# Patient Record
Sex: Female | Born: 1985
Health system: Southern US, Community
[De-identification: ages and names within clinical notes are randomized; demographics above are authoritative.]

## PROBLEM LIST (undated history)

## (undated) DIAGNOSIS — O139 Gestational [pregnancy-induced] hypertension without significant proteinuria, unspecified trimester: Secondary | ICD-10-CM

## (undated) DIAGNOSIS — N730 Acute parametritis and pelvic cellulitis: Secondary | ICD-10-CM

## (undated) DIAGNOSIS — R51 Headache: Secondary | ICD-10-CM

## (undated) DIAGNOSIS — Z889 Allergy status to unspecified drugs, medicaments and biological substances status: Secondary | ICD-10-CM

## (undated) DIAGNOSIS — N760 Acute vaginitis: Secondary | ICD-10-CM

## (undated) DIAGNOSIS — N390 Urinary tract infection, site not specified: Secondary | ICD-10-CM

## (undated) DIAGNOSIS — B9620 Unspecified Escherichia coli [E. coli] as the cause of diseases classified elsewhere: Secondary | ICD-10-CM

## (undated) DIAGNOSIS — B9689 Other specified bacterial agents as the cause of diseases classified elsewhere: Secondary | ICD-10-CM

## (undated) HISTORY — PX: FOOT SURGERY: SHX648

---

## 2009-09-08 ENCOUNTER — Emergency Department (HOSPITAL_COMMUNITY): Admission: EM | Admit: 2009-09-08 | Discharge: 2009-09-08 | Payer: Self-pay | Admitting: Emergency Medicine

## 2010-09-18 LAB — URINALYSIS, ROUTINE W REFLEX MICROSCOPIC
Glucose, UA: NEGATIVE mg/dL
Ketones, ur: 15 mg/dL — AB
Protein, ur: 30 mg/dL — AB
pH: 6.5 (ref 5.0–8.0)

## 2010-09-18 LAB — URINE MICROSCOPIC-ADD ON

## 2011-04-22 ENCOUNTER — Ambulatory Visit (INDEPENDENT_AMBULATORY_CARE_PROVIDER_SITE_OTHER): Payer: Self-pay

## 2011-04-22 ENCOUNTER — Inpatient Hospital Stay (INDEPENDENT_AMBULATORY_CARE_PROVIDER_SITE_OTHER)
Admission: RE | Admit: 2011-04-22 | Discharge: 2011-04-22 | Disposition: A | Discharge status: Home / Self Care | Payer: Self-pay | Source: Ambulatory Visit | Attending: Family Medicine | Admitting: Family Medicine

## 2011-04-22 DIAGNOSIS — J45909 Unspecified asthma, uncomplicated: Secondary | ICD-10-CM

## 2011-04-22 DIAGNOSIS — J069 Acute upper respiratory infection, unspecified: Secondary | ICD-10-CM

## 2011-04-28 ENCOUNTER — Inpatient Hospital Stay (HOSPITAL_COMMUNITY)
Admission: EM | Admit: 2011-04-28 | Discharge: 2011-04-30 | DRG: 418 | Disposition: A | Discharge status: Home / Self Care | Payer: Medicaid Other | Source: Ambulatory Visit | Attending: Surgery | Admitting: Surgery

## 2011-04-28 DIAGNOSIS — N390 Urinary tract infection, site not specified: Secondary | ICD-10-CM | POA: Diagnosis present

## 2011-04-28 DIAGNOSIS — K8000 Calculus of gallbladder with acute cholecystitis without obstruction: Principal | ICD-10-CM | POA: Diagnosis present

## 2011-04-28 DIAGNOSIS — J45909 Unspecified asthma, uncomplicated: Secondary | ICD-10-CM | POA: Diagnosis present

## 2011-04-28 DIAGNOSIS — K810 Acute cholecystitis: Secondary | ICD-10-CM

## 2011-04-28 HISTORY — DX: Headache: R51

## 2011-04-28 LAB — URINALYSIS, ROUTINE W REFLEX MICROSCOPIC
Glucose, UA: NEGATIVE mg/dL
Nitrite: POSITIVE — AB
Specific Gravity, Urine: 1.023 (ref 1.005–1.030)
pH: 6.5 (ref 5.0–8.0)

## 2011-04-28 LAB — URINE MICROSCOPIC-ADD ON

## 2011-04-28 LAB — COMPREHENSIVE METABOLIC PANEL
ALT: 143 U/L — ABNORMAL HIGH (ref 0–35)
AST: 76 U/L — ABNORMAL HIGH (ref 0–37)
Alkaline Phosphatase: 176 U/L — ABNORMAL HIGH (ref 39–117)
CO2: 24 mEq/L (ref 19–32)
Calcium: 9 mg/dL (ref 8.4–10.5)
GFR calc non Af Amer: >90 mL/min (ref >90)
Potassium: 3 mEq/L — ABNORMAL LOW (ref 3.5–5.1)
Sodium: 139 mEq/L (ref 135–145)

## 2011-04-28 LAB — CBC
MCH: 30 pg (ref 26.0–34.0)
Platelets: 341 10*3/uL (ref 150–400)
RBC: 3.73 MIL/uL — ABNORMAL LOW (ref 3.87–5.11)
WBC: 13.1 10*3/uL — ABNORMAL HIGH (ref 4.0–10.5)

## 2011-04-29 ENCOUNTER — Emergency Department (HOSPITAL_COMMUNITY): Payer: Medicaid Other

## 2011-04-29 ENCOUNTER — Other Ambulatory Visit (INDEPENDENT_AMBULATORY_CARE_PROVIDER_SITE_OTHER): Payer: Self-pay | Admitting: Surgery

## 2011-04-29 ENCOUNTER — Encounter (HOSPITAL_COMMUNITY): Admission: EM | Disposition: A | Discharge status: Home / Self Care | Payer: Self-pay | Source: Ambulatory Visit

## 2011-04-29 ENCOUNTER — Inpatient Hospital Stay (HOSPITAL_COMMUNITY): Payer: Medicaid Other

## 2011-04-29 ENCOUNTER — Inpatient Hospital Stay (HOSPITAL_COMMUNITY): Payer: Medicaid Other | Admitting: Anesthesiology

## 2011-04-29 ENCOUNTER — Encounter (HOSPITAL_COMMUNITY): Payer: Self-pay | Admitting: *Deleted

## 2011-04-29 ENCOUNTER — Encounter (HOSPITAL_COMMUNITY): Payer: Self-pay | Admitting: Anesthesiology

## 2011-04-29 DIAGNOSIS — K8010 Calculus of gallbladder with chronic cholecystitis without obstruction: Secondary | ICD-10-CM

## 2011-04-29 DIAGNOSIS — K810 Acute cholecystitis: Secondary | ICD-10-CM

## 2011-04-29 HISTORY — PX: CHOLECYSTECTOMY: SHX55

## 2011-04-29 LAB — DIFFERENTIAL
Basophils Absolute: 0 10*3/uL (ref 0.0–0.1)
Eosinophils Relative: 1 % (ref 0–5)
Lymphocytes Relative: 16 % (ref 12–46)
Monocytes Relative: 7 % (ref 3–12)
Neutrophils Relative %: 76 % (ref 43–77)

## 2011-04-29 SURGERY — LAPAROSCOPIC CHOLECYSTECTOMY WITH INTRAOPERATIVE CHOLANGIOGRAM
Anesthesia: General

## 2011-04-29 MED ORDER — ONDANSETRON HCL 4 MG/2ML IJ SOLN: 4.0000 mg | INTRAMUSCULAR | Status: DC | PRN

## 2011-04-29 MED ORDER — MEPERIDINE HCL 25 MG/ML IJ SOLN: 6.2500 mg | INTRAMUSCULAR | Status: DC | PRN

## 2011-04-29 MED ORDER — GLYCOPYRROLATE 0.2 MG/ML IJ SOLN: INTRAMUSCULAR | Status: DC | PRN

## 2011-04-29 MED ORDER — CIPROFLOXACIN IN D5W 400 MG/200ML IV SOLN
400.0000 mg | Freq: Two times a day (BID) | INTRAVENOUS | Status: DC
  Filled 2011-04-29 (×2): qty 200

## 2011-04-29 MED ORDER — ONDANSETRON HCL 4 MG/2ML IJ SOLN: 4.0000 mg | Freq: Four times a day (QID) | INTRAMUSCULAR | Status: DC | PRN

## 2011-04-29 MED ORDER — ALBUTEROL SULFATE (2.5 MG/3ML) 0.083% IN NEBU
INHALATION_SOLUTION | RESPIRATORY_TRACT | Status: DC | PRN
  Administered 2011-04-29: 2.5 mg via RESPIRATORY_TRACT

## 2011-04-29 MED ORDER — LORAZEPAM 2 MG/ML IJ SOLN: 1.0000 mg | Freq: Once | INTRAMUSCULAR | Status: AC | PRN

## 2011-04-29 MED ORDER — CIPROFLOXACIN IN D5W 400 MG/200ML IV SOLN
400.0000 mg | Freq: Two times a day (BID) | INTRAVENOUS | Status: AC
  Administered 2011-04-30: 400 mg via INTRAVENOUS
  Filled 2011-04-29 (×3): qty 200

## 2011-04-29 MED ORDER — NEOSTIGMINE METHYLSULFATE 1 MG/ML IJ SOLN: INTRAMUSCULAR | Status: DC | PRN

## 2011-04-29 MED ORDER — DROPERIDOL 2.5 MG/ML IJ SOLN
INTRAMUSCULAR | Status: DC | PRN
  Administered 2011-04-29: 0.625 mg via INTRAVENOUS

## 2011-04-29 MED ORDER — CIPROFLOXACIN IN D5W 400 MG/200ML IV SOLN
INTRAVENOUS | Status: DC | PRN
  Administered 2011-04-29: 400 mg via INTRAVENOUS

## 2011-04-29 MED ORDER — INFLUENZA VIRUS VACC SPLIT PF IM SUSP
0.5000 mL | Freq: Once | INTRAMUSCULAR | Status: AC
  Administered 2011-04-30: 0.5 mL via INTRAMUSCULAR
  Filled 2011-04-29 (×2): qty 0.5

## 2011-04-29 MED ORDER — PROMETHAZINE HCL 25 MG/ML IJ SOLN
6.2500 mg | INTRAMUSCULAR | Status: DC | PRN
  Administered 2011-04-29: 12 mg via INTRAVENOUS
  Filled 2011-04-29: qty 1

## 2011-04-29 MED ORDER — HYDROMORPHONE HCL PF 1 MG/ML IJ SOLN
0.2500 mg | INTRAMUSCULAR | Status: DC | PRN
  Administered 2011-04-29 (×2): 0.5 mg via INTRAVENOUS

## 2011-04-29 MED ORDER — MIDAZOLAM HCL 5 MG/5ML IJ SOLN
INTRAMUSCULAR | Status: DC | PRN
  Administered 2011-04-29: 2 mg via INTRAVENOUS

## 2011-04-29 MED ORDER — DEXAMETHASONE SODIUM PHOSPHATE 4 MG/ML IJ SOLN
INTRAMUSCULAR | Status: DC | PRN
  Administered 2011-04-29: 8 mg via INTRAVENOUS

## 2011-04-29 MED ORDER — METOCLOPRAMIDE HCL 5 MG/ML IJ SOLN
INTRAMUSCULAR | Status: DC | PRN
  Administered 2011-04-29: 10 mg via INTRAVENOUS

## 2011-04-29 MED ORDER — MORPHINE SULFATE 4 MG/ML IJ SOLN
INTRAMUSCULAR | Status: AC
  Administered 2011-04-29: 2 mg via INTRAVENOUS
  Filled 2011-04-29: qty 1

## 2011-04-29 MED ORDER — IBUPROFEN 600 MG PO TABS
600.0000 mg | ORAL_TABLET | Freq: Four times a day (QID) | ORAL | Status: DC | PRN
  Filled 2011-04-29: qty 1

## 2011-04-29 MED ORDER — NEOSTIGMINE METHYLSULFATE 1 MG/ML IJ SOLN
INTRAMUSCULAR | Status: DC | PRN
  Administered 2011-04-29: 5 mg via INTRAVENOUS
  Administered 2011-04-29: 1 mg via INTRAVENOUS

## 2011-04-29 MED ORDER — MORPHINE SULFATE 4 MG/ML IJ SOLN
INTRAMUSCULAR | Status: AC
  Administered 2011-04-29: 4 mg
  Filled 2011-04-29: qty 1

## 2011-04-29 MED ORDER — FENTANYL CITRATE 0.05 MG/ML IJ SOLN
INTRAMUSCULAR | Status: DC | PRN
  Administered 2011-04-29 (×4): 100 ug via INTRAVENOUS

## 2011-04-29 MED ORDER — LACTATED RINGERS IV SOLN
INTRAVENOUS | Status: DC | PRN
  Administered 2011-04-29 (×2): via INTRAVENOUS

## 2011-04-29 MED ORDER — SODIUM CHLORIDE 0.9 % IR SOLN
Status: DC | PRN
  Administered 2011-04-29: 1000 mL

## 2011-04-29 MED ORDER — ONDANSETRON HCL 4 MG PO TABS: 4.0000 mg | ORAL_TABLET | Freq: Four times a day (QID) | ORAL | Status: DC | PRN

## 2011-04-29 MED ORDER — HYDROMORPHONE HCL PF 1 MG/ML IJ SOLN
1.0000 mg | INTRAMUSCULAR | Status: DC | PRN
  Administered 2011-04-29 – 2011-04-30 (×2): 1 mg via INTRAVENOUS
  Filled 2011-04-29 (×3): qty 1

## 2011-04-29 MED ORDER — GLYCOPYRROLATE 0.2 MG/ML IJ SOLN
INTRAMUSCULAR | Status: DC | PRN
  Administered 2011-04-29: 0.2 mg via INTRAVENOUS
  Administered 2011-04-29: .6 mg via INTRAVENOUS

## 2011-04-29 MED ORDER — KCL IN DEXTROSE-NACL 20-5-0.45 MEQ/L-%-% IV SOLN
INTRAVENOUS | Status: DC
  Administered 2011-04-29 – 2011-04-30 (×2): via INTRAVENOUS
  Filled 2011-04-29 (×5): qty 1000

## 2011-04-29 MED ORDER — WHITE PETROLATUM GEL
Status: AC
  Filled 2011-04-29: qty 5

## 2011-04-29 MED ORDER — DEXTROSE IN LACTATED RINGERS 5 % IV SOLN
INTRAVENOUS | Status: DC
  Administered 2011-04-29: 12:00:00 via INTRAVENOUS

## 2011-04-29 MED ORDER — ROCURONIUM BROMIDE 100 MG/10ML IV SOLN
INTRAVENOUS | Status: DC | PRN
  Administered 2011-04-29: 20 mg via INTRAVENOUS
  Administered 2011-04-29: 50 mg via INTRAVENOUS

## 2011-04-29 MED ORDER — CIPROFLOXACIN IN D5W 400 MG/200ML IV SOLN
400.0000 mg | INTRAVENOUS | Status: DC
  Filled 2011-04-29 (×2): qty 200

## 2011-04-29 MED ORDER — PNEUMOCOCCAL VAC POLYVALENT 25 MCG/0.5ML IJ INJ
0.5000 mL | INJECTION | INTRAMUSCULAR | Status: AC
  Administered 2011-04-30: 0.5 mL via INTRAMUSCULAR
  Filled 2011-04-29: qty 0.5

## 2011-04-29 MED ORDER — BUPIVACAINE-EPINEPHRINE 0.25% -1:200000 IJ SOLN
INTRAMUSCULAR | Status: DC | PRN
  Administered 2011-04-29: 8 mL

## 2011-04-29 MED ORDER — MORPHINE SULFATE 2 MG/ML IJ SOLN: 1.0000 mg | INTRAMUSCULAR | Status: DC | PRN

## 2011-04-29 MED ORDER — DEXTROSE 5 % IV SOLN
INTRAVENOUS | Status: DC | PRN
  Administered 2011-04-29: 13:00:00 via INTRAVENOUS

## 2011-04-29 MED ORDER — ONDANSETRON HCL 4 MG/2ML IJ SOLN
INTRAMUSCULAR | Status: DC | PRN
  Administered 2011-04-29 (×2): 4 mg via INTRAVENOUS

## 2011-04-29 MED ORDER — IOHEXOL 300 MG/ML  SOLN
INTRAMUSCULAR | Status: DC | PRN
  Administered 2011-04-29: 50 mL

## 2011-04-29 MED ORDER — PROPOFOL 10 MG/ML IV EMUL
INTRAVENOUS | Status: DC | PRN
  Administered 2011-04-29: 180 mg via INTRAVENOUS
  Administered 2011-04-29: 20 mg via INTRAVENOUS

## 2011-04-29 MED ORDER — OXYCODONE-ACETAMINOPHEN 5-325 MG PO TABS
1.0000 | ORAL_TABLET | ORAL | Status: DC | PRN
  Administered 2011-04-30 (×2): 2 via ORAL
  Filled 2011-04-29 (×2): qty 2

## 2011-04-29 SURGICAL SUPPLY — 42 items
APPLIER CLIP ROT 10 11.4 M/L (STAPLE) ×2
BENZOIN TINCTURE PRP APPL 2/3 (GAUZE/BANDAGES/DRESSINGS) ×2 IMPLANT
BLADE SURG ROTATE 9660 (MISCELLANEOUS) IMPLANT
CANISTER SUCTION 2500CC (MISCELLANEOUS) ×2 IMPLANT
CHLORAPREP W/TINT 26ML (MISCELLANEOUS) ×2 IMPLANT
CLIP APPLIE ROT 10 11.4 M/L (STAPLE) ×1 IMPLANT
CLOSURE STERI STRIP 1/2 X4 (GAUZE/BANDAGES/DRESSINGS) ×2 IMPLANT
CLOTH BEACON ORANGE TIMEOUT ST (SAFETY) ×2 IMPLANT
COVER MAYO STAND STRL (DRAPES) ×2 IMPLANT
COVER SURGICAL LIGHT HANDLE (MISCELLANEOUS) ×2 IMPLANT
DECANTER SPIKE VIAL GLASS SM (MISCELLANEOUS) ×2 IMPLANT
DRAPE C-ARM 42X72 X-RAY (DRAPES) ×2 IMPLANT
DRAPE UTILITY 15X26 W/TAPE STR (DRAPE) ×4 IMPLANT
DRSG OPSITE 4X5.5 SM (GAUZE/BANDAGES/DRESSINGS) ×2 IMPLANT
DRSG TEGADERM 4X4.75 (GAUZE/BANDAGES/DRESSINGS) ×2 IMPLANT
ELECT REM PT RETURN 9FT ADLT (ELECTROSURGICAL) ×2
ELECTRODE REM PT RTRN 9FT ADLT (ELECTROSURGICAL) ×1 IMPLANT
FILTER SMOKE EVAC LAPAROSHD (FILTER) ×2 IMPLANT
GAUZE SPONGE 2X2 8PLY STRL LF (GAUZE/BANDAGES/DRESSINGS) ×1 IMPLANT
GLOVE BIO SURGEON STRL SZ7 (GLOVE) ×2 IMPLANT
GLOVE BIOGEL PI IND STRL 7.5 (GLOVE) ×1 IMPLANT
GLOVE BIOGEL PI INDICATOR 7.5 (GLOVE) ×1
GOWN STRL NON-REIN LRG LVL3 (GOWN DISPOSABLE) ×6 IMPLANT
KIT BASIN OR (CUSTOM PROCEDURE TRAY) ×2 IMPLANT
KIT ROOM TURNOVER OR (KITS) ×2 IMPLANT
NS IRRIG 1000ML POUR BTL (IV SOLUTION) ×2 IMPLANT
PAD ARMBOARD 7.5X6 YLW CONV (MISCELLANEOUS) ×2 IMPLANT
POUCH SPECIMEN RETRIEVAL 10MM (ENDOMECHANICALS) ×2 IMPLANT
SCISSORS LAP 5X35 DISP (ENDOMECHANICALS) IMPLANT
SET CHOLANGIOGRAPH 5 50 .035 (SET/KITS/TRAYS/PACK) ×2 IMPLANT
SET IRRIG TUBING LAPAROSCOPIC (IRRIGATION / IRRIGATOR) ×2 IMPLANT
SLEEVE ENDOPATH XCEL 5M (ENDOMECHANICALS) ×2 IMPLANT
SPECIMEN JAR SMALL (MISCELLANEOUS) ×2 IMPLANT
SPONGE GAUZE 2X2 STER 10/PKG (GAUZE/BANDAGES/DRESSINGS) ×1
SUT MNCRL AB 4-0 PS2 18 (SUTURE) ×2 IMPLANT
TOWEL OR 17X24 6PK STRL BLUE (TOWEL DISPOSABLE) ×2 IMPLANT
TOWEL OR 17X26 10 PK STRL BLUE (TOWEL DISPOSABLE) ×2 IMPLANT
TRAY LAPAROSCOPIC (CUSTOM PROCEDURE TRAY) ×2 IMPLANT
TROCAR XCEL BLUNT TIP 100MML (ENDOMECHANICALS) ×2 IMPLANT
TROCAR XCEL NON-BLD 11X100MML (ENDOMECHANICALS) ×2 IMPLANT
TROCAR XCEL NON-BLD 5MMX100MML (ENDOMECHANICALS) ×2 IMPLANT
WATER STERILE IRR 1000ML POUR (IV SOLUTION) IMPLANT

## 2011-04-29 NOTE — Op Note (Signed)
Laparoscopic Cholecystectomy with IOC Procedure Note  Indications: This patient presents with symptomatic gallbladder disease and will undergo laparoscopic cholecystectomy.  Pre-operative Diagnosis: Calculus of gallbladder with other cholecystitis, without mention of obstruction  Post-operative Diagnosis: Same  Surgeon: Lakisha Peyser K.   Assistants: Darnell Level  Anesthesia: General endotracheal anesthesia  ASA Class: 2  Procedure Details  The patient was seen again in the Holding Room. The risks, benefits, complications, treatment options, and expected outcomes were discussed with the patient. The possibilities of reaction to medication, pulmonary aspiration, perforation of viscus, bleeding, recurrent infection, finding a normal gallbladder, the need for additional procedures, failure to diagnose a condition, the possible need to convert to an open procedure, and creating a complication requiring transfusion or operation were discussed with the patient. The likelihood of improving the patient's symptoms with return to their baseline status is good.  The patient and/or family concurred with the proposed plan, giving informed consent. The site of surgery properly noted. The patient was taken to Operating Room, identified as Emily Davila and the procedure verified as Laparoscopic Cholecystectomy with Intraoperative Cholangiogram. A Time Out was held and the above information confirmed.  Prior to the induction of general anesthesia, antibiotic prophylaxis was administered. General endotracheal anesthesia was then administered and tolerated well. After the induction, the abdomen was prepped with Chloraprep and draped in the sterile fashion. The patient was positioned in the supine position.  Local anesthetic agent was injected into the skin near the umbilicus and an incision made. We dissected down to the abdominal fascia with blunt dissection.  The fascia was incised vertically and we entered  the peritoneal cavity bluntly.  A pursestring suture of 0-Vicryl was placed around the fascial opening.  The Hasson cannula was inserted and secured with the stay suture.  Pneumoperitoneum was then created with CO2 and tolerated well without any adverse changes in the patient's vital signs. An 11-mm port was placed in the subxiphoid position.  Two 5-mm ports were placed in the right upper quadrant. All skin incisions were infiltrated with a local anesthetic agent before making the incision and placing the trocars.   We positioned the patient in reverse Trendelenburg, tilted slightly to the patient's left.  The gallbladder was identified, the fundus grasped and retracted cephalad. Adhesions were lysed bluntly and with the electrocautery where indicated, taking care not to injure any adjacent organs or viscus. The infundibulum was grasped and retracted laterally, exposing the peritoneum overlying the triangle of Calot. This was then divided and exposed in a blunt fashion. A critical view of the cystic duct and cystic artery was obtained.  The cystic duct was clearly identified and bluntly dissected circumferentially. The cystic duct was ligated with a clip distally.   An incision was made in the cystic duct and the [facility] cholangiogram catheter introduced. The catheter was secured using a clip. A cholangiogram was then obtained which showed good visualization of the distal and proximal biliary tree with no sign of filling defects or obstruction.  Contrast flowed easily into the duodenum. The catheter was then removed.   The cystic duct was then ligated with clips and divided. The cystic artery was identified, dissected free, ligated with clips and divided as well.   The gallbladder was dissected from the liver bed in retrograde fashion with the electrocautery. The gallbladder was removed and placed in an Endocatch sac. The liver bed was irrigated and inspected. Hemostasis was achieved with the electrocautery.  Copious irrigation was utilized and was repeatedly aspirated until clear.  The gallbladder and Endocatch sac were then removed through the umbilical port site.  The pursestring suture was used to close the umbilical fascia.    We again inspected the right upper quadrant for hemostasis.  Pneumoperitoneum was released as we removed the trocars.  4-0 Monocryl was used to close the skin.   Benzoin, steri-strips, and clean dressings were applied. The patient was then extubated and brought to the recovery room in stable condition. Instrument, sponge, and needle counts were correct at closure and at the conclusion of the case.   Findings: Cholecystitis with Cholelithiasis  Estimated Blood Loss: Minimal                Specimens: Gallbladder           Complications: None; patient tolerated the procedure well.         Disposition: PACU - hemodynamically stable.         Condition: stable

## 2011-04-29 NOTE — Anesthesia Preprocedure Evaluation (Addendum)
Anesthesia Evaluation  Patient identified by MRN, date of birth, ID band Patient awake    Reviewed: Allergy & Precautions, H&P , NPO status   Airway Mallampati: II  Neck ROM: Full    Dental  (+) Dental Advisory Given and Teeth Intact   Pulmonary asthma , Current Smoker,  clear to auscultation        Cardiovascular neg cardio ROS Regular Normal    Neuro/Psych  Headaches,    GI/Hepatic GERD-  ,  Endo/Other    Renal/GU      Musculoskeletal   Abdominal (+)  Abdomen: tender.    Peds  Hematology   Anesthesia Other Findings   Reproductive/Obstetrics                        Anesthesia Physical Anesthesia Plan  ASA: II and Emergent  Anesthesia Plan: General   Post-op Pain Management:    Induction: Intravenous  Airway Management Planned: Oral ETT  Additional Equipment:   Intra-op Plan:   Post-operative Plan:   Informed Consent: I have reviewed the patients History and Physical, chart, labs and discussed the procedure including the risks, benefits and alternatives for the proposed anesthesia with the patient or authorized representative who has indicated his/her understanding and acceptance.   Dental advisory given  Plan Discussed with: CRNA and Surgeon  Anesthesia Plan Comments:        Anesthesia Quick Evaluation

## 2011-04-29 NOTE — Anesthesia Postprocedure Evaluation (Signed)
  Anesthesia Post-op Note  Patient: Leisure centre manager  Procedure(s) Performed:  LAPAROSCOPIC CHOLECYSTECTOMY WITH INTRAOPERATIVE CHOLANGIOGRAM - latex allergy, inpatient 5127  Patient Location: PACU  Anesthesia Type: General  Level of Consciousness: alert , oriented and sedated  Airway and Oxygen Therapy: Patient Spontanous Breathing  Post-op Pain: mild  Post-op Assessment: Post-op Vital signs reviewed, Patient's Cardiovascular Status Stable, Respiratory Function Stable, Patent Airway, No signs of Nausea or vomiting, Adequate PO intake and Pain level controlled  Post-op Vital Signs: stable  Complications: No apparent anesthesia complications

## 2011-04-29 NOTE — Preoperative (Signed)
Beta Blockers   Reason not to administer Beta Blockers:Not Applicable 

## 2011-04-29 NOTE — Transfer of Care (Signed)
Immediate Anesthesia Transfer of Care Note  Patient: Leisure centre manager  Procedure(s) Performed:  LAPAROSCOPIC CHOLECYSTECTOMY WITH INTRAOPERATIVE CHOLANGIOGRAM - latex allergy, inpatient 5127  Patient Location: PACU  Anesthesia Type: General  Level of Consciousness: sedated, patient cooperative and responds to stimulation  Airway & Oxygen Therapy: Patient Spontanous Breathing and Patient connected to nasal cannula oxygen  Post-op Assessment: Report given to PACU RN, Post -op Vital signs reviewed and stable and Patient moving all extremities X 4  Post vital signs: stable  Complications: No apparent anesthesia complications

## 2011-04-29 NOTE — Progress Notes (Signed)
Pt remains symptomatic with epigastric pain.  Plan lap chole with IOC today.  The surgical procedure has been discussed with the patient.  Potential risks, benefits, alternative treatments, and expected outcomes have been explained.  All of the patient's questions at this time have been answered.  The likelihood of reaching the patient's treatment goal is good.  The patient understand the proposed surgical procedure and wishes to proceed.

## 2011-04-29 NOTE — Consult Note (Signed)
NAME:  Emily Davila, Emily Davila             ACCOUNT NO.:  [id number]  MEDICAL RECORD NO.:  [id number]  LOCATION:  5127                         FACILITY:  [facility]  PHYSICIAN:  Maisie Fus A. Adell Panek, M.D.DATE OF BIRTH:  [birth year]  DATE OF CONSULTATION:  05/09/2011 DATE OF DISCHARGE:                                CONSULTATION   CHIEF COMPLAINT:  Abdominal pain.  PHYSICIAN REQUESTING CONSULTATION:  Dr. Norlene Campbell.  HISTORY OF PRESENT ILLNESS:  The patient is a 25 year old female with a 1-day history of right upper quadrant pain, nausea, and vomiting.  The pain came on yesterday.  Made worse with eating.  The pain is moderate to severe in the epigastric right upper quadrant.  Made better without eating.  Ultrasound done showed gallstones and a positive Murphy sign. I was asked to see her.  Denies any dysuria or polyuria.  PAST MEDICAL HISTORY:  None.  PAST SURGICAL HISTORY:  None.  FAMILY HISTORY:  None.  SOCIAL HISTORY:  Does smoke.  Does not drink.  REVIEW OF SYSTEMS:  Positive for abdominal pain.  Otherwise negative x15 points.  MEDICATIONS:  None.  ALLERGIES:  LATEX.  PHYSICAL EXAMINATION:  VITAL SIGNS:  Temperature 99, pulse 98, blood pressure 133/74. GENERAL APPEARANCE:  Pleasant female in no apparent distress. HEENT:  No jaundice.  Oropharynx moist.  NECK:  Supple, nontender. Trachea midline.  PULMONARY:  Lung sounds are clear.  Chest wall motion normal. CARDIOVASCULAR:  Regular rate and rhythm without rub, murmur, or gallop. ABDOMEN:  Tender in right upper quadrant.  Positive Murphy sign.  No hernia.  EXTREMITIES:  No clubbing, cyanosis, or edema. NEURO:  Normal.  DIAGNOSTIC STUDIES:  Ultrasound shows gallstones without a thickened gallbladder wall, positive Murphy sign.  She has [place] [address], hemoglobin 11, platelet count 341,000, lipase 12.  She is nitrite positive, leukocyte positive on urinalysis.  Serum pregnancy test is negative.  Sodium 139, potassium 3, chloride  104, CO2 24, BUN 6, creatinine 0.62, alk phos 176, SGOT 76, SGPT 143.  IMPRESSION:  Acute cholecystitis.  Urine tract infection.  PLAN:  Admit IV fluids, antibiotics, probable lap chole tomorrow.     Mikaya Bunner A. Pepper Wyndham, M.D.     TAC/MEDQ  D:  04/29/2011  T:  04/29/2011  Job:  [id number]  cc:   Dr. Norlene Campbell

## 2011-04-30 MED ORDER — OXYCODONE-ACETAMINOPHEN 5-325 MG PO TABS: 1.0000 | ORAL_TABLET | ORAL | Status: AC | PRN

## 2011-04-30 MED ORDER — OXYCODONE-ACETAMINOPHEN 5-325 MG PO TABS: 1.0000 | ORAL_TABLET | ORAL | Status: DC | PRN

## 2011-04-30 NOTE — Discharge Summary (Signed)
Date of admission: 04/29/2011 Admitting physician: Dr. Luisa Hart Date of discharge: 04/30/2011 Discharging physician: Dr. Michaell Cowing  Consultants: None Procedures: Laparoscopic cholecystectomy by Dr. Corliss Skains on an 04/29/2011  Reason for admission: The patient is a 25 year old black female who presented to the emergency department with a one-day history of right upper quadrant pain. She was noted to have nausea and vomiting as well. Upon arrival she had further workup with an abdominal ultrasound which revealed cholelithiasis with a positive Murphy sign. We were asked to evaluate the patient for surgical admission for possible cholecystitis.  Admitting diagnoses: 1. Cholecystitis 2. Urinary tract infection  Hospital course: The patient was admitted and placed on IV antibiotics. She was taken to the operating room where she underwent a laparoscopic cholecystectomy. The patient tolerated the procedure well. Postoperatively her diet was advanced as tolerated. Her abdomen was soft appropriately tender with active bowel sounds. All of her incisions were clean dry and intact. On postoperative day 1 because the patient was doing so well she was felt stable for discharge home.  Discharge diagnoses: 1. Cholecystitis, status post laparoscopic cholecystectomy 2. Urinary tract infection  Discharge medications: 1. Percocet 5/325 mg 1-2 by mouth every 4 hours when necessary pain 2 she may resume her albuterol that she takes at home  Discharge instructions: The patient may increase her activity slowly and walk up steps. She may shower however she is not to bathe for the next 2 weeks. She may remove her Tegaderm and gauze tomorrow but she is to leave her Steri-Strips on for a week. She may resume a regular diet however she is encouraged to stay away from high fat or greasy food. She will follow up with me [facility] in our office on 05/15/2011 at 1:50 PM. She is to call office sooner if she develops worsening pain or  fever greater than 101.5.

## 2011-04-30 NOTE — Progress Notes (Signed)
Patient discharge home. Home discharge instruction given, patient is alert and oriented, not in any distress, incision intact, dressing changed prior to discharge per MD order.

## 2011-04-30 NOTE — Discharge Summary (Signed)
Pt stable for D/C  Pain controlled.  Tol PO   Will have close follow-up

## 2011-05-02 NOTE — Progress Notes (Signed)
Utilization review completed. Suits, Teri Diane11/12/2010  

## 2011-05-04 ENCOUNTER — Encounter (HOSPITAL_COMMUNITY): Payer: Self-pay | Admitting: Surgery

## 2011-05-15 ENCOUNTER — Ambulatory Visit (INDEPENDENT_AMBULATORY_CARE_PROVIDER_SITE_OTHER): Payer: Medicaid Other | Admitting: Radiology

## 2011-05-15 VITALS — BP 124/82 | HR 70 | Temp 97.0°F | Resp 16 | Ht 65.0 in | Wt 209.2 lb

## 2011-05-15 DIAGNOSIS — K819 Cholecystitis, unspecified: Secondary | ICD-10-CM | POA: Insufficient documentation

## 2011-05-15 NOTE — Progress Notes (Signed)
Emily Davila Oct 27, 1985 [id number] 05/15/2011   Emily Davila is a 25 y.o. female who had a laparoscopic cholecystectomy with intraoperative cholangiogram.  The pathology report confirmed Cholecystitis.  The patient reports that they are feeling well with normal bowel movements and good appetite. She has some intermittent nausea despite eating bland, but it has not occurred every day She does have some loose BMs but not too frequent.    Physical examination - Incisions appear well-healed with no sign of infection or bleeding.   Abdomen - soft, non-tender. Small suture tail trimmed back to skin level.  Impression:  s/p laparoscopic cholecystectomy  Plan:  She may resume a regular diet and full activity.  She may follow-up on a PRN basis.

## 2012-01-17 ENCOUNTER — Emergency Department (HOSPITAL_COMMUNITY)
Admission: EM | Admit: 2012-01-17 | Discharge: 2012-01-17 | Disposition: A | Discharge status: Home / Self Care | Payer: Medicaid Other | Source: Home / Self Care | Attending: Emergency Medicine | Admitting: Emergency Medicine

## 2012-01-17 ENCOUNTER — Encounter (HOSPITAL_COMMUNITY): Payer: Self-pay

## 2012-01-17 DIAGNOSIS — N730 Acute parametritis and pelvic cellulitis: Secondary | ICD-10-CM

## 2012-01-17 DIAGNOSIS — N390 Urinary tract infection, site not specified: Secondary | ICD-10-CM

## 2012-01-17 LAB — POCT URINALYSIS DIP (DEVICE)
Ketones, ur: NEGATIVE mg/dL
Protein, ur: NEGATIVE mg/dL
Specific Gravity, Urine: 1.025 (ref 1.005–1.030)
pH: 7 (ref 5.0–8.0)

## 2012-01-17 LAB — POCT PREGNANCY, URINE: Preg Test, Ur: NEGATIVE

## 2012-01-17 LAB — WET PREP, GENITAL
Trich, Wet Prep: NONE SEEN
Yeast Wet Prep HPF POC: NONE SEEN

## 2012-01-17 MED ORDER — METRONIDAZOLE 500 MG PO TABS: 500.0000 mg | ORAL_TABLET | Freq: Three times a day (TID) | ORAL | Status: AC

## 2012-01-17 MED ORDER — LIDOCAINE HCL (PF) 1 % IJ SOLN
INTRAMUSCULAR | Status: AC
  Filled 2012-01-17: qty 5

## 2012-01-17 MED ORDER — CEPHALEXIN 500 MG PO CAPS: 500.0000 mg | ORAL_CAPSULE | Freq: Three times a day (TID) | ORAL | Status: AC

## 2012-01-17 MED ORDER — CEFTRIAXONE SODIUM 250 MG IJ SOLR
250.0000 mg | Freq: Once | INTRAMUSCULAR | Status: AC
  Administered 2012-01-17: 250 mg via INTRAMUSCULAR

## 2012-01-17 MED ORDER — TRAMADOL HCL 50 MG PO TABS: 100.0000 mg | ORAL_TABLET | Freq: Three times a day (TID) | ORAL | Status: AC | PRN

## 2012-01-17 MED ORDER — CEFTRIAXONE SODIUM 250 MG IJ SOLR
INTRAMUSCULAR | Status: AC
  Filled 2012-01-17: qty 250

## 2012-01-17 MED ORDER — AZITHROMYCIN 250 MG PO TABS
1000.0000 mg | ORAL_TABLET | Freq: Once | ORAL | Status: AC
  Administered 2012-01-17: 1000 mg via ORAL

## 2012-01-17 MED ORDER — AZITHROMYCIN 250 MG PO TABS
ORAL_TABLET | ORAL | Status: AC
  Filled 2012-01-17: qty 4

## 2012-01-17 NOTE — ED Provider Notes (Signed)
Chief Complaint  Patient presents with  . Vaginal Bleeding    History of Present Illness:   Emily Davila is a 26 year old female who had a normal menstrual period July 2 to July 8. Over the past 2 days she's had some pink spotting and some abdominal discomfort. She describes this as a bloating rather than a pain. It's lower abdominal and bilateral. She also has some lower back pain over the past 2-3 days which is fairly severe. This does not radiate down her legs. She went to the plasma center to donate some plasma today and her temperature was 101. Here it is 99.9. She is sexually active and not using any birth control. She denies any urinary symptoms. She requests STD testing. Her partner is here to be tested as well. She denies any morning sickness. She has had some breast soreness. She denies nausea or vomiting.  Review of Systems:  Other than noted above, the patient denies any of the following symptoms: Systemic:  No fever, chills, sweats, fatigue, or weight loss. GI:  No abdominal pain, nausea, anorexia, vomiting, diarrhea, constipation, melena or hematochezia. GU:  No dysuria, frequency, urgency, hematuria, vaginal discharge, itching, or abnormal vaginal bleeding. Skin:  No rash or itching.   PMFSH:  Past medical history, family history, social history, meds, and allergies were reviewed.  Physical Exam:   Vital signs:  BP 123/85  Pulse 81  Temp 99.9 F (37.7 C) (Oral)  Resp 18  SpO2 100%  LMP 12/25/2011 General:  Alert, oriented and in no distress. Lungs:  Breath sounds clear and equal bilaterally.  No wheezes, rales or rhonchi. Heart:  Regular rhythm.  No gallops or murmers. Abdomen:  Soft, flat and non-distended.  No organomegaly or mass.  No tenderness, guarding or rebound.  Bowel sounds normally active. Pelvic exam:  Normal external genitalia. There is a small amount of blood in the vaginal vault with no bleeding coming from the cervical os. The cervix appears normal. There is no  cervical motion pain. Uterus is midposition and normal in size and shape without any uterine tenderness. She has mild adnexal tenderness bilaterally but no mass. Skin:  Clear, warm and dry.  Labs:   Results for orders placed during the hospital encounter of 01/17/12  POCT URINALYSIS DIP (DEVICE)      Component Value Range   Glucose, UA NEGATIVE  NEGATIVE mg/dL   Bilirubin Urine NEGATIVE  NEGATIVE   Ketones, ur NEGATIVE  NEGATIVE mg/dL   Specific Gravity, Urine 1.025  1.005 - 1.030   Hgb urine dipstick LARGE (*) NEGATIVE   pH 7.0  5.0 - 8.0   Protein, ur NEGATIVE  NEGATIVE mg/dL   Urobilinogen, UA 0.2  0.0 - 1.0 mg/dL   Nitrite POSITIVE (*) NEGATIVE   Leukocytes, UA NEGATIVE  NEGATIVE  POCT PREGNANCY, URINE      Component Value Range   Preg Test, Ur NEGATIVE  NEGATIVE    Other Labs Obtained at Urgent Care Center:  A urine culture was obtained as well as a wet prep, GC and Chlamydia DNA probe, and serology for HIV and syphilis.  Results are pending at this time and we will call about any positive results.  Course in Urgent Care Center:   She was given Rocephin 250 mg IM and azithromycin 1000 mg by mouth and tolerated these well without any immediate side effects.  Assessment:  The primary encounter diagnosis was PID (acute pelvic inflammatory disease). A diagnosis of UTI (lower urinary tract infection) was  also pertinent to this visit.  Plan:   1.  The following meds were prescribed:   New Prescriptions   CEPHALEXIN (KEFLEX) 500 MG CAPSULE    Take 1 capsule (500 mg total) by mouth 3 (three) times daily.   METRONIDAZOLE (FLAGYL) 500 MG TABLET    Take 1 tablet (500 mg total) by mouth 3 (three) times daily.   TRAMADOL (ULTRAM) 50 MG TABLET    Take 2 tablets (100 mg total) by mouth every 8 (eight) hours as needed for pain.   2.  The patient was instructed in symptomatic care and handouts were given. 3.  The patient was told to return if becoming worse in any way, if no better in 3 or 4  days, and given some red flag symptoms that would indicate earlier return. The patient was told to return here in 48 hours for recheck and if she should get worse in the meantime especially with heavier vaginal bleeding, to [facility].    Reuben Likes, MD 01/17/12 [phone]

## 2012-01-17 NOTE — ED Notes (Signed)
C/o vaginal spotting for 3 days, low back pain and intermittent low abdominal pressure.  Denies urinary sx.  States LMP was 12/25/11, wants STD testing.  Partner is here to be tested as well.

## 2012-01-18 LAB — HIV ANTIBODY (ROUTINE TESTING W REFLEX): HIV: NONREACTIVE

## 2012-01-18 LAB — RPR: RPR Ser Ql: NONREACTIVE

## 2012-01-19 LAB — URINE CULTURE

## 2012-01-21 NOTE — ED Notes (Signed)
Urine culture: >100,000 colonies E. Coli.  Pt. adequately treated with Keflex. Emily Davila 01/21/2012

## 2012-02-28 ENCOUNTER — Emergency Department (INDEPENDENT_AMBULATORY_CARE_PROVIDER_SITE_OTHER): Payer: Medicaid Other

## 2012-02-28 ENCOUNTER — Emergency Department (INDEPENDENT_AMBULATORY_CARE_PROVIDER_SITE_OTHER)
Admission: EM | Admit: 2012-02-28 | Discharge: 2012-02-28 | Disposition: A | Discharge status: Home / Self Care | Payer: Medicaid Other | Source: Home / Self Care | Attending: Emergency Medicine | Admitting: Emergency Medicine

## 2012-02-28 ENCOUNTER — Encounter (HOSPITAL_COMMUNITY): Payer: Self-pay | Admitting: Emergency Medicine

## 2012-02-28 DIAGNOSIS — J329 Chronic sinusitis, unspecified: Secondary | ICD-10-CM

## 2012-02-28 DIAGNOSIS — J189 Pneumonia, unspecified organism: Secondary | ICD-10-CM

## 2012-02-28 HISTORY — DX: Unspecified Escherichia coli (E. coli) as the cause of diseases classified elsewhere: N39.0

## 2012-02-28 HISTORY — DX: Urinary tract infection, site not specified: B96.20

## 2012-02-28 HISTORY — DX: Other specified bacterial agents as the cause of diseases classified elsewhere: N76.0

## 2012-02-28 HISTORY — DX: Acute parametritis and pelvic cellulitis: N73.0

## 2012-02-28 HISTORY — DX: Other specified bacterial agents as the cause of diseases classified elsewhere: B96.89

## 2012-02-28 LAB — POCT URINALYSIS DIP (DEVICE)
Leukocytes, UA: NEGATIVE
Protein, ur: 30 mg/dL — AB
Urobilinogen, UA: 1 mg/dL (ref 0.0–1.0)

## 2012-02-28 LAB — POCT PREGNANCY, URINE: Preg Test, Ur: NEGATIVE

## 2012-02-28 MED ORDER — ALBUTEROL SULFATE (5 MG/ML) 0.5% IN NEBU
INHALATION_SOLUTION | RESPIRATORY_TRACT | Status: AC
  Filled 2012-02-28: qty 1

## 2012-02-28 MED ORDER — DOXYCYCLINE HYCLATE 100 MG PO CAPS: 100.0000 mg | ORAL_CAPSULE | Freq: Two times a day (BID) | ORAL | Status: AC

## 2012-02-28 MED ORDER — ALBUTEROL SULFATE HFA 108 (90 BASE) MCG/ACT IN AERS: 2.0000 | INHALATION_SPRAY | RESPIRATORY_TRACT | Status: DC | PRN

## 2012-02-28 MED ORDER — ACETAMINOPHEN 325 MG PO TABS
ORAL_TABLET | ORAL | Status: AC
  Filled 2012-02-28: qty 2

## 2012-02-28 MED ORDER — IPRATROPIUM BROMIDE 0.02 % IN SOLN
0.5000 mg | Freq: Once | RESPIRATORY_TRACT | Status: AC
  Administered 2012-02-28: 0.5 mg via RESPIRATORY_TRACT

## 2012-02-28 MED ORDER — GUAIFENESIN-CODEINE 100-10 MG/5ML PO SYRP: 5.0000 mL | ORAL_SOLUTION | Freq: Four times a day (QID) | ORAL | Status: AC | PRN

## 2012-02-28 MED ORDER — DEXAMETHASONE 4 MG PO TABS
ORAL_TABLET | ORAL | Status: AC
  Filled 2012-02-28: qty 4

## 2012-02-28 MED ORDER — FLUTICASONE PROPIONATE 50 MCG/ACT NA SUSP: 2.0000 | Freq: Every day | NASAL | Status: DC

## 2012-02-28 MED ORDER — DEXAMETHASONE 4 MG PO TABS
16.0000 mg | ORAL_TABLET | Freq: Once | ORAL | Status: AC
  Administered 2012-02-28: 16 mg via ORAL

## 2012-02-28 MED ORDER — PSEUDOEPHEDRINE-GUAIFENESIN ER 120-1200 MG PO TB12: 1.0000 | ORAL_TABLET | Freq: Two times a day (BID) | ORAL | Status: DC

## 2012-02-28 MED ORDER — DEXAMETHASONE 4 MG PO TABS: ORAL_TABLET | ORAL | Status: AC

## 2012-02-28 MED ORDER — ACETAMINOPHEN 325 MG PO TABS
650.0000 mg | ORAL_TABLET | Freq: Once | ORAL | Status: AC
  Administered 2012-02-28: 650 mg via ORAL

## 2012-02-28 MED ORDER — ALBUTEROL SULFATE (5 MG/ML) 0.5% IN NEBU
5.0000 mg | INHALATION_SOLUTION | Freq: Once | RESPIRATORY_TRACT | Status: AC
  Administered 2012-02-28: 5 mg via RESPIRATORY_TRACT

## 2012-02-28 MED ORDER — IBUPROFEN 800 MG PO TABS: 800.0000 mg | ORAL_TABLET | Freq: Three times a day (TID) | ORAL | Status: AC | PRN

## 2012-02-28 NOTE — ED Notes (Signed)
Reports onset of symptoms yesterday: chills, fever (101-102) , shortness of breath.  Has cough, non-productive, runny nose.

## 2012-02-28 NOTE — ED Notes (Signed)
Provided blankets, pillow, repositioned bed

## 2012-02-28 NOTE — ED Notes (Signed)
Patient transported to X-ray 

## 2012-02-28 NOTE — ED Notes (Signed)
Patient not in treatment room, obtaining urine specimen

## 2012-02-28 NOTE — ED Provider Notes (Signed)
History     CSN: [id number]  Arrival date & time 02/28/12  1718   First MD Initiated Contact with Patient 02/28/12 1727      Chief Complaint  Patient presents with  . URI    (Consider location/radiation/quality/duration/timing/severity/associated sxs/prior treatment) HPI Comments: Patient reports malaise, fevers Tmax 102, coughing, wheezing, shortness of breath, bodyaches, rhinorrhea starting yesterday. Some sore throat. No headaches, ear pain, nausea, vomiting, abdominal pain, urinary complaints, rash. Has been taking Alka-Seltzer plus cold and flu without improvement. Does not have any albuterol at home. History of asthma. Patient is a smoker. 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  ROS as noted in HPI. All other ROS negative.   Patient is a 26 y.o. female presenting with URI. The history is provided by the patient. No language interpreter was used.  URI The primary symptoms include fever, cough, wheezing and myalgias. The current episode started yesterday. This is a new problem. The problem has not changed since onset. The maximum temperature recorded prior to her arrival was 102 to 102.9 F.  The cough is non-productive.  Symptoms associated with the illness include chills, facial pain, sinus pressure, congestion and rhinorrhea. The illness is not associated with plugged ear sensation. The following treatments were addressed: Acetaminophen was effective.    Past Medical History  Diagnosis Date  . Asthma   . Headache   . BV (bacterial vaginosis)   . E. coli UTI   . PID (acute pelvic inflammatory disease)     Past Surgical History  Procedure Date  . Cholecystectomy 04/29/2011    Procedure: LAPAROSCOPIC CHOLECYSTECTOMY WITH INTRAOPERATIVE CHOLANGIOGRAM;  Surgeon: Wilmon Arms. Corliss Skains, MD;  Location: [facility] OR;  Service: General;  Laterality: N/A;  latex allergy, inpatient 5127    No family history on file.  History  Substance Use Topics  . Smoking  status: Current Everyday Smoker -- 0.2 packs/day for 3 years  . Smokeless tobacco: Not on file  . Alcohol Use: No    OB History    Grav Para Term Preterm Abortions TAB SAB Ect Mult Living                  Review of Systems  Constitutional: Positive for fever and chills.  HENT: Positive for congestion, rhinorrhea and sinus pressure.   Respiratory: Positive for cough and wheezing.   Musculoskeletal: Positive for myalgias.    Allergies  Latex  Home Medications   Current Outpatient Rx  Name Route Sig Dispense Refill  . ALBUTEROL SULFATE HFA 108 (90 BASE) MCG/ACT IN AERS Inhalation Inhale 2 puffs into the lungs every 4 (four) hours as needed for wheezing or shortness of breath. wheezing 1 Inhaler 0  . BUPAP PO Oral Take by mouth.    . DEXAMETHASONE 4 MG PO TABS  4 tabs (16 mg) po at once 4 tablet 0  . DOXYCYCLINE HYCLATE 100 MG PO CAPS Oral Take 1 capsule (100 mg total) by mouth 2 (two) times daily. X 7 days 14 capsule 0  . FLUTICASONE PROPIONATE 50 MCG/ACT NA SUSP Nasal Place 2 sprays into the nose daily. 16 g 0  . GUAIFENESIN-CODEINE 100-10 MG/5ML PO SYRP Oral Take 5 mLs by mouth 4 (four) times daily as needed for cough. 120 mL 0  . IBUPROFEN 800 MG PO TABS Oral Take 1 tablet (800 mg total) by mouth every 8 (eight) hours as needed for pain or fever. 20 tablet 0  . PSEUDOEPHEDRINE-GUAIFENESIN ER [phone] MG PO  TB12 Oral Take 1 tablet by mouth 2 (two) times daily. 20 each 0    BP 130/84  Pulse 96  Temp 101.4 F (38.6 C) (Oral)  Resp 16  SpO2 100%  LMP 02/24/2012  Physical Exam  Nursing note and vitals reviewed. Constitutional: She is oriented to person, place, and time. She appears well-developed and well-nourished. No distress.  HENT:  Head: Normocephalic and atraumatic.  Right Ear: Tympanic membrane normal.  Left Ear: Tympanic membrane normal.  Nose: Mucosal edema and rhinorrhea present. Right sinus exhibits frontal sinus tenderness. Right sinus exhibits no maxillary  sinus tenderness. Left sinus exhibits frontal sinus tenderness.  Mouth/Throat: Uvula is midline and oropharynx is clear and moist.  Eyes: Conjunctivae and EOM are normal.  Neck: Normal range of motion. Neck supple.  Cardiovascular: Normal rate, regular rhythm and normal heart sounds.   Pulmonary/Chest: Effort normal. She has decreased breath sounds. She has wheezes. She has no rhonchi. She has no rales.  Abdominal: Bowel sounds are normal. She exhibits no distension. There is no tenderness. There is no CVA tenderness.  Musculoskeletal: Normal range of motion.  Lymphadenopathy:    She has no cervical adenopathy.  Neurological: She is alert and oriented to person, place, and time. Coordination normal.  Skin: Skin is warm and dry.  Psychiatric: She has a normal mood and affect. Her behavior is normal. Judgment and thought content normal.    ED Course  Procedures (including critical care time)  Labs Reviewed  POCT URINALYSIS DIP (DEVICE) - Abnormal; Notable for the following:    Bilirubin Urine SMALL (*)     Ketones, ur TRACE (*)     Hgb urine dipstick SMALL (*)     Protein, ur 30 (*)     All other components within normal limits  POCT PREGNANCY, URINE   Dg Chest 2 View  02/28/2012  *RADIOLOGY REPORT*  Clinical Data: Fever and cough.  Shortness of breath for 2 days.  CHEST - 2 VIEW  Comparison: 04/22/2011  Findings: Cardiomediastinal silhouette is within normal limits. The lungs are free of focal consolidations and pleural effusions. No pulmonary edema. Visualized osseous structures have a normal appearance.  IMPRESSION: Negative exam.   Original Report Authenticated By: Patterson Hammersmith, M.D.      1. Sinusitis   2. Pneumonia      Results for orders placed during the hospital encounter of 02/28/12  POCT URINALYSIS DIP (DEVICE)      Component Value Range   Glucose, UA NEGATIVE  NEGATIVE mg/dL   Bilirubin Urine SMALL (*) NEGATIVE   Ketones, ur TRACE (*) NEGATIVE mg/dL   Specific  Gravity, Urine 1.015  1.005 - 1.030   Hgb urine dipstick SMALL (*) NEGATIVE   pH 7.5  5.0 - 8.0   Protein, ur 30 (*) NEGATIVE mg/dL   Urobilinogen, UA 1.0  0.0 - 1.0 mg/dL   Nitrite NEGATIVE  NEGATIVE   Leukocytes, UA NEGATIVE  NEGATIVE  POCT PREGNANCY, URINE      Component Value Range   Preg Test, Ur NEGATIVE  NEGATIVE    MDM  Pt appears to be in NAD. febile but satting 100% on RA. Checking x-ray to rule out pneumonia. Has some wheezing, poor air movement. Checking x-ray to rule out pneumonia. Giving Decadron, DuoNeb for presumed asthma exacerbation.  Imaging reviewed by myself. Report per radiologist.  On reexamination, patient states she feels much better. Improved air movement, and no wheezing on exam. Discussed labs, imaging, plan with patient. Discussed same  symptoms that should prompt return to the department. Patient agrees with plan.   No evidence of pharyngitis or OM. No evidence of neck stiffness or other sx to support meningitis. No evidence of dehydration. Abd S/NT/ND without peritoneal sx. Doubt intraabdominal process. No evidence of UTI. Pt able to tolerate PO. Patient has some sinus tenderness, Suspect viral syndrome causing her symptoms, but given history of fever 102, does meet guidelines for antibiotics to treat sinus infection. Will cover for PNA as well with doxy. Refilling albuterol, cough syrup, steroids x1 more day, ibuprofen, increase fluids. Will treat symptomatically and have pt f/u with PCP of choice prn.  Luiz Blare, MD 02/28/12 [phone]

## 2015-06-02 DIAGNOSIS — J4520 Mild intermittent asthma, uncomplicated: Secondary | ICD-10-CM | POA: Insufficient documentation

## 2015-09-02 DIAGNOSIS — F419 Anxiety disorder, unspecified: Secondary | ICD-10-CM | POA: Insufficient documentation

## 2015-09-02 HISTORY — DX: Anxiety disorder, unspecified: F41.9

## 2016-10-04 ENCOUNTER — Emergency Department (HOSPITAL_BASED_OUTPATIENT_CLINIC_OR_DEPARTMENT_OTHER)
Admission: EM | Admit: 2016-10-04 | Discharge: 2016-10-04 | Disposition: A | Discharge status: Home / Self Care | Payer: Medicaid Other | Attending: Emergency Medicine | Admitting: Emergency Medicine

## 2016-10-04 ENCOUNTER — Encounter (HOSPITAL_BASED_OUTPATIENT_CLINIC_OR_DEPARTMENT_OTHER): Payer: Self-pay

## 2016-10-04 DIAGNOSIS — H9202 Otalgia, left ear: Secondary | ICD-10-CM | POA: Insufficient documentation

## 2016-10-04 DIAGNOSIS — R51 Headache: Secondary | ICD-10-CM | POA: Insufficient documentation

## 2016-10-04 DIAGNOSIS — R61 Generalized hyperhidrosis: Secondary | ICD-10-CM | POA: Insufficient documentation

## 2016-10-04 DIAGNOSIS — R519 Headache, unspecified: Secondary | ICD-10-CM

## 2016-10-04 DIAGNOSIS — Z87891 Personal history of nicotine dependence: Secondary | ICD-10-CM | POA: Insufficient documentation

## 2016-10-04 DIAGNOSIS — R42 Dizziness and giddiness: Secondary | ICD-10-CM | POA: Insufficient documentation

## 2016-10-04 DIAGNOSIS — R509 Fever, unspecified: Secondary | ICD-10-CM | POA: Insufficient documentation

## 2016-10-04 DIAGNOSIS — J45909 Unspecified asthma, uncomplicated: Secondary | ICD-10-CM | POA: Insufficient documentation

## 2016-10-04 DIAGNOSIS — R05 Cough: Secondary | ICD-10-CM | POA: Insufficient documentation

## 2016-10-04 HISTORY — DX: Allergy status to unspecified drugs, medicaments and biological substances: Z88.9

## 2016-10-04 MED ORDER — IBUPROFEN 400 MG PO TABS
600.0000 mg | ORAL_TABLET | Freq: Once | ORAL | Status: AC
  Administered 2016-10-04: 600 mg via ORAL
  Filled 2016-10-04: qty 1

## 2016-10-04 NOTE — ED Notes (Signed)
ED Provider at bedside. 

## 2016-10-04 NOTE — ED Provider Notes (Signed)
[facility]   CSN: [id number] Arrival date & time: 10/04/16  1934  By signing my name below, I, Nelwyn Salisbury, attest that this documentation has been prepared under the direction and in the presence of non-physician practitioner, Felicie Morn, NP. Electronically Signed: Nelwyn Salisbury, Scribe. 10/04/2016. 7:54 PM.  History   Chief Complaint Chief Complaint  Patient presents with  . Ear Pain   The history is provided by the patient. No language interpreter was used.  Otalgia  This is a new problem. The current episode started more than 2 days ago. There is pain in the left ear. The problem occurs constantly. The problem has not changed since onset.Maximum temperature: Subjective. The fever has been present for 3 to 4 days. The pain is mild. Associated symptoms include cough. Pertinent negatives include no vomiting.    HPI Comments:  Emily Davila is a 31 y.o. female who presents to the Emergency Department complaining of constant, mild left-sided ear pain onset 3 days ago. She states that her pain begins in the back left of her head behind her ear and radiates forward into the left side of her face and neck. Her pain is exacerbated with swallowing. Pt reports associated photophobia, dizziness, diaphoresis, fever and productive cough. She has tried tea tree oil in her ears with no relief. Denies any nausea or vomiting.   Past Medical History:  Diagnosis Date  . Asthma   . BV (bacterial vaginosis)   . E. coli UTI   . H/O seasonal allergies   . Headache(784.0)   . PID (acute pelvic inflammatory disease)     Patient Active Problem List   Diagnosis Date Noted  . Cholecystitis 05/15/2011    Past Surgical History:  Procedure Laterality Date  . CHOLECYSTECTOMY  04/29/2011   Procedure: LAPAROSCOPIC CHOLECYSTECTOMY WITH INTRAOPERATIVE CHOLANGIOGRAM;  Surgeon: Wilmon Arms. Corliss Skains, MD;  Location: [facility] OR;  Service: General;  Laterality: N/A;  latex allergy, inpatient  5127  . FOOT SURGERY      OB History    No data available       Home Medications    Prior to Admission medications   Medication Sig Start Date End Date Taking? Authorizing Provider  albuterol (PROVENTIL HFA;VENTOLIN HFA) 108 (90 BASE) MCG/ACT inhaler Inhale 2 puffs into the lungs every 4 (four) hours as needed for wheezing or shortness of breath. wheezing 02/28/12   Domenick Gong, MD    Family History No family history on file.  Social History Social History  Substance Use Topics  . Smoking status: Former Games developer  . Smokeless tobacco: Never Used  . Alcohol use No     Allergies   Latex   Review of Systems Review of Systems  Constitutional: Positive for diaphoresis and fever.  HENT: Positive for ear pain.   Eyes: Positive for photophobia.  Respiratory: Positive for cough.   Gastrointestinal: Negative for nausea and vomiting.  Neurological: Positive for dizziness.  All other systems reviewed and are negative.    Physical Exam Updated Vital Signs BP (!) 120/92 (BP Location: Left Arm)   Pulse 86   Temp 98.5 F (36.9 C) (Oral)   Resp 18   Ht  (1.676 m)   Wt 230 lb (104.3 kg)   LMP 09/25/2016   SpO2 100%   BMI 37.12 kg/m   Physical Exam  Constitutional: She is oriented to person, place, and time. She appears well-developed and well-nourished. No distress.  HENT:  Head: Normocephalic and atraumatic.  Left  ear canal mildly erythematous.   Eyes: EOM are normal. Right eye exhibits no nystagmus. Left eye exhibits no nystagmus.  Neck: Normal range of motion.  Cardiovascular: Normal rate, regular rhythm and normal heart sounds.   Pulmonary/Chest: Effort normal and breath sounds normal.  Abdominal: Soft. She exhibits no distension. There is no tenderness.  Musculoskeletal: Normal range of motion.  Neurological: She is alert and oriented to person, place, and time. She has normal strength. No cranial nerve deficit.  Skin: Skin is warm and dry.    Psychiatric: She has a normal mood and affect. Judgment normal.  Nursing note and vitals reviewed.    ED Treatments / Results  DIAGNOSTIC STUDIES:  Oxygen Saturation is 100% on RA, normal by my interpretation.    COORDINATION OF CARE:  8:04 PM Discussed treatment plan with pt at bedside which includes symptomatic management and pt agreed to plan.  Labs (all labs ordered are listed, but only abnormal results are displayed) Labs Reviewed - No data to display  EKG  EKG Interpretation None       Radiology No results found.  Procedures Procedures (including critical care time)  Medications Ordered in ED Medications  ibuprofen (ADVIL,MOTRIN) tablet 600 mg (not administered)     Initial Impression / Assessment and Plan / ED Course  I have reviewed the triage vital signs and the nursing notes.  Pertinent labs & imaging results that were available during my care of the patient were reviewed by me and considered in my medical decision making (see chart for details).     Pt with headache and left otalgia . Presentation non concerning for [facility], ICH, Meningitis, or temporal arteritis. Pt is afebrile with no focal neuro deficits, nuchal rigidity, or change in vision. Care instructions provided. Return precautions discussed. Pt verbalizes understanding and is agreeable with plan to dc.   Final Clinical Impressions(s) / ED Diagnoses   Final diagnoses:  Otalgia of left ear  Acute nonintractable headache, unspecified headache type    New Prescriptions Discharge Medication List as of 10/04/2016  8:30 PM      I personally performed the services described in this documentation, which was scribed in my presence. The recorded information has been reviewed and is accurate.     Felicie Morn, NP 10/05/16 0004    Nira Conn, MD 10/05/16 0100

## 2016-10-04 NOTE — ED Triage Notes (Signed)
c/o pain to left ear, left side of throat-numbness to left side of face, off balance x 3 days-NAD-steady gait

## 2016-12-06 ENCOUNTER — Encounter (HOSPITAL_BASED_OUTPATIENT_CLINIC_OR_DEPARTMENT_OTHER): Payer: Self-pay | Admitting: *Deleted

## 2016-12-06 ENCOUNTER — Emergency Department (HOSPITAL_BASED_OUTPATIENT_CLINIC_OR_DEPARTMENT_OTHER)
Admission: EM | Admit: 2016-12-06 | Discharge: 2016-12-06 | Disposition: A | Discharge status: Home / Self Care | Payer: Medicaid Other | Attending: Emergency Medicine | Admitting: Emergency Medicine

## 2016-12-06 DIAGNOSIS — Z91040 Latex allergy status: Secondary | ICD-10-CM | POA: Insufficient documentation

## 2016-12-06 DIAGNOSIS — J45909 Unspecified asthma, uncomplicated: Secondary | ICD-10-CM | POA: Insufficient documentation

## 2016-12-06 DIAGNOSIS — Z87891 Personal history of nicotine dependence: Secondary | ICD-10-CM | POA: Insufficient documentation

## 2016-12-06 DIAGNOSIS — B349 Viral infection, unspecified: Secondary | ICD-10-CM

## 2016-12-06 LAB — CBC WITH DIFFERENTIAL/PLATELET
Basophils Absolute: 0 10*3/uL (ref 0.0–0.1)
Basophils Relative: 0 %
EOS PCT: 0 %
Eosinophils Absolute: 0 10*3/uL (ref 0.0–0.7)
HEMATOCRIT: 37.6 % (ref 36.0–46.0)
Hemoglobin: 13.1 g/dL (ref 12.0–15.0)
LYMPHS ABS: 2.7 10*3/uL (ref 0.7–4.0)
LYMPHS PCT: 26 %
MCH: 30 pg (ref 26.0–34.0)
MCHC: 34.8 g/dL (ref 30.0–36.0)
MCV: 86.2 fL (ref 78.0–100.0)
MONO ABS: 1 10*3/uL (ref 0.1–1.0)
Monocytes Relative: 10 %
Neutro Abs: 6.5 10*3/uL (ref 1.7–7.7)
Neutrophils Relative %: 64 %
PLATELETS: 336 10*3/uL (ref 150–400)
RBC: 4.36 MIL/uL (ref 3.87–5.11)
RDW: 13.8 % (ref 11.5–15.5)
WBC: 10.3 10*3/uL (ref 4.0–10.5)

## 2016-12-06 LAB — URINALYSIS, ROUTINE W REFLEX MICROSCOPIC
Bilirubin Urine: NEGATIVE
GLUCOSE, UA: NEGATIVE mg/dL
HGB URINE DIPSTICK: NEGATIVE
Ketones, ur: NEGATIVE mg/dL
Leukocytes, UA: NEGATIVE
Nitrite: NEGATIVE
PH: 8.5 — AB (ref 5.0–8.0)
PROTEIN: NEGATIVE mg/dL
Specific Gravity, Urine: 1.016 (ref 1.005–1.030)

## 2016-12-06 LAB — PREGNANCY, URINE: Preg Test, Ur: NEGATIVE

## 2016-12-06 LAB — BASIC METABOLIC PANEL
Anion gap: 8 (ref 5–15)
BUN: 9 mg/dL (ref 6–20)
CHLORIDE: 106 mmol/L (ref 101–111)
CO2: 22 mmol/L (ref 22–32)
Calcium: 8.7 mg/dL — ABNORMAL LOW (ref 8.9–10.3)
Creatinine, Ser: 0.85 mg/dL (ref 0.44–1.00)
GFR calc Af Amer: >60 mL/min (ref >60)
GFR calc non Af Amer: >60 mL/min (ref >60)
GLUCOSE: 103 mg/dL — AB (ref 65–99)
POTASSIUM: 3.6 mmol/L (ref 3.5–5.1)
Sodium: 136 mmol/L (ref 135–145)

## 2016-12-06 MED ORDER — SODIUM CHLORIDE 0.9 % IV BOLUS (SEPSIS)
1000.0000 mL | Freq: Once | INTRAVENOUS | Status: AC
  Administered 2016-12-06: 1000 mL via INTRAVENOUS

## 2016-12-06 MED ORDER — KETOROLAC TROMETHAMINE 30 MG/ML IJ SOLN
30.0000 mg | Freq: Once | INTRAMUSCULAR | Status: AC
  Administered 2016-12-06: 30 mg via INTRAVENOUS
  Filled 2016-12-06: qty 1

## 2016-12-06 MED ORDER — ACETAMINOPHEN 500 MG PO TABS
1000.0000 mg | ORAL_TABLET | Freq: Once | ORAL | Status: AC
  Administered 2016-12-06: 1000 mg via ORAL
  Filled 2016-12-06: qty 2

## 2016-12-06 NOTE — ED Triage Notes (Signed)
Pt. On monitor with  Noted PVCs

## 2016-12-06 NOTE — ED Notes (Signed)
ED Provider at bedside. 

## 2016-12-06 NOTE — ED Provider Notes (Signed)
[facility]   CSN: [id number] Arrival date & time: 12/06/16  1821  By signing my name below, I, Modena JanskyAlbert Thayil, attest that this documentation has been prepared under the direction and in the presence of Geoffery Lyonselo, Jarrah Babich, MD. Electronically Signed: Modena JanskyAlbert Thayil, Scribe. 12/06/2016. 6:51 PM.  History   Chief Complaint Chief Complaint  Patient presents with  . Headache   The history is provided by the patient. No language interpreter was used.  Headache   This is a new problem. The current episode started 6 to 12 hours ago. The problem occurs constantly. The problem has been gradually worsening. The headache is associated with bright light. The pain is moderate. The pain does not radiate. Associated symptoms include a fever (Subjective), malaise/fatigue and nausea. Pertinent negatives include no vomiting. She has tried resting in a darkened room for the symptoms. The treatment provided mild relief.   HPI Comments: Emily Davila is a 31 y.o. female with a PMHx of asthma who presents to the Emergency Department complaining of constant moderate headache that started this morning. She states her headache gradually worsened throughout the day with gradual onset of associated symptoms: fever (subjective), chills, fatigue, photophobia, nausea, generalized myalgias, neck pain, and dehydration. Resting provides minimal relief. Pt's temperature in the ED today was 101F. Denies any sore throat, ear pain, cough, vomiting, or other complaints at this time.  Past Medical History:  Diagnosis Date  . Asthma   . BV (bacterial vaginosis)   . E. coli UTI   . H/O seasonal allergies   . Headache(784.0)   . PID (acute pelvic inflammatory disease)     Patient Active Problem List   Diagnosis Date Noted  . Cholecystitis 05/15/2011    Past Surgical History:  Procedure Laterality Date  . CHOLECYSTECTOMY  04/29/2011   Procedure: LAPAROSCOPIC CHOLECYSTECTOMY WITH INTRAOPERATIVE  CHOLANGIOGRAM;  Surgeon: Wilmon ArmsMatthew K. Corliss Skainssuei, MD;  Location: [facility] OR;  Service: General;  Laterality: N/A;  latex allergy, inpatient 5127  . FOOT SURGERY      OB History    No data available       Home Medications    Prior to Admission medications   Medication Sig Start Date End Date Taking? Authorizing Provider  albuterol (PROVENTIL HFA;VENTOLIN HFA) 108 (90 BASE) MCG/ACT inhaler Inhale 2 puffs into the lungs every 4 (four) hours as needed for wheezing or shortness of breath. wheezing 02/28/12   Domenick GongMortenson, Ashley, MD    Family History No family history on file.  Social History Social History  Substance Use Topics  . Smoking status: Former Games developermoker  . Smokeless tobacco: Never Used  . Alcohol use No     Allergies   Latex   Review of Systems Review of Systems  Constitutional: Positive for chills, fever (Subjective) and malaise/fatigue.  HENT: Negative for ear pain and sore throat.   Eyes: Positive for photophobia.  Respiratory: Negative for cough.   Gastrointestinal: Positive for nausea. Negative for vomiting.  Endocrine: Positive for polydipsia.  Musculoskeletal: Positive for myalgias (Generalized) and neck pain.  Neurological: Positive for headaches.  All other systems reviewed and are negative.    Physical Exam Updated Vital Signs BP 121/81 (BP Location: Right Arm)   Pulse 93   Temp (!) 101 F (38.3 C) (Oral)   Ht 5\' 6"  (1.676 m)   Wt 130 lb (59 kg)   LMP 11/23/2016   SpO2 100%   BMI 20.98 kg/m   Physical Exam  Constitutional: She is oriented to person,  place, and time. She appears well-developed and well-nourished. No distress.  HENT:  Head: Normocephalic and atraumatic.  Mouth/Throat: Oropharynx is clear and moist. No oropharyngeal exudate.  Eyes: Conjunctivae and EOM are normal. Pupils are equal, round, and reactive to light.  Neck: Normal range of motion. Neck supple.  No meningismus.  Cardiovascular: Normal rate, regular rhythm, normal heart sounds and  intact distal pulses.   No murmur heard. Pulmonary/Chest: Effort normal and breath sounds normal. No respiratory distress.  Abdominal: Soft. There is no tenderness. There is no rebound and no guarding.  Musculoskeletal: Normal range of motion. She exhibits no edema or tenderness.  Lymphadenopathy:    She has no cervical adenopathy.  Neurological: She is alert and oriented to person, place, and time. No cranial nerve deficit. She exhibits normal muscle tone. Coordination normal.   5/5 strength throughout. CN 2-12 intact.Equal grip strength.   Skin: Skin is warm.  Psychiatric: She has a normal mood and affect. Her behavior is normal.  Nursing note and vitals reviewed.    ED Treatments / Results  DIAGNOSTIC STUDIES: Oxygen Saturation is 100% on RA, normal by my interpretation.    COORDINATION OF CARE: 6:55 PM- Pt advised of plan for treatment and pt agrees.  Labs (all labs ordered are listed, but only abnormal results are displayed) Labs Reviewed  BASIC METABOLIC PANEL  CBC WITH DIFFERENTIAL/PLATELET  URINALYSIS, ROUTINE W REFLEX MICROSCOPIC  PREGNANCY, URINE    EKG  EKG Interpretation None       Radiology No results found.  Procedures Procedures (including critical care time)  Medications Ordered in ED Medications  ketorolac (TORADOL) 30 MG/ML injection 30 mg (not administered)  acetaminophen (TYLENOL) tablet 1,000 mg (not administered)  sodium chloride 0.9 % bolus 1,000 mL (1,000 mLs Intravenous New Bag/Given 12/06/16 1900)     Initial Impression / Assessment and Plan / ED Course  I have reviewed the triage vital signs and the nursing notes.  Pertinent labs & imaging results that were available during my care of the patient were reviewed by me and considered in my medical decision making (see chart for details).  Patient presents with complaints of headache. She has a fever here in the emergency department, however no nuchal rigidity. I highly suspect her  symptoms are viral in nature. She was given saline, Toradol, Tylenol, and is feeling much better. She will be discharged with fluids, Tylenol, Motrin, and return as needed.  Final Clinical Impressions(s) / ED Diagnoses   Final diagnoses:  None    New Prescriptions New Prescriptions   No medications on file   I personally performed the services described in this documentation, which was scribed in my presence. The recorded information has been reviewed and is accurate.        Geoffery Lyons, MD 12/06/16 [phone]

## 2016-12-06 NOTE — Discharge Instructions (Signed)
Tylenol 1000 mg rotated with Motrin 600 mg every 4 hours as for pain or fever.  Return to the emergency department if you develop chest pain, difficulty breathing, worsening headache, or other new and concerning symptoms.

## 2016-12-08 ENCOUNTER — Encounter (HOSPITAL_BASED_OUTPATIENT_CLINIC_OR_DEPARTMENT_OTHER): Payer: Self-pay | Admitting: *Deleted

## 2016-12-08 ENCOUNTER — Emergency Department (HOSPITAL_BASED_OUTPATIENT_CLINIC_OR_DEPARTMENT_OTHER)
Admission: EM | Admit: 2016-12-08 | Discharge: 2016-12-08 | Disposition: A | Discharge status: Home / Self Care | Payer: Self-pay | Attending: Emergency Medicine | Admitting: Emergency Medicine

## 2016-12-08 DIAGNOSIS — Z79899 Other long term (current) drug therapy: Secondary | ICD-10-CM | POA: Insufficient documentation

## 2016-12-08 DIAGNOSIS — R51 Headache: Secondary | ICD-10-CM | POA: Insufficient documentation

## 2016-12-08 DIAGNOSIS — Z87891 Personal history of nicotine dependence: Secondary | ICD-10-CM | POA: Insufficient documentation

## 2016-12-08 DIAGNOSIS — J45909 Unspecified asthma, uncomplicated: Secondary | ICD-10-CM | POA: Insufficient documentation

## 2016-12-08 DIAGNOSIS — M791 Myalgia, unspecified site: Secondary | ICD-10-CM

## 2016-12-08 DIAGNOSIS — R519 Headache, unspecified: Secondary | ICD-10-CM

## 2016-12-08 DIAGNOSIS — Z91040 Latex allergy status: Secondary | ICD-10-CM | POA: Insufficient documentation

## 2016-12-08 DIAGNOSIS — B349 Viral infection, unspecified: Secondary | ICD-10-CM

## 2016-12-08 LAB — URINALYSIS, MICROSCOPIC (REFLEX)

## 2016-12-08 LAB — URINALYSIS, ROUTINE W REFLEX MICROSCOPIC
Glucose, UA: NEGATIVE mg/dL
Ketones, ur: >80 mg/dL — AB
Nitrite: NEGATIVE
Protein, ur: 30 mg/dL — AB
Specific Gravity, Urine: 1.039 — ABNORMAL HIGH (ref 1.005–1.030)
pH: 6 (ref 5.0–8.0)

## 2016-12-08 LAB — CBC WITH DIFFERENTIAL/PLATELET
Basophils Absolute: 0 K/uL (ref 0.0–0.1)
Basophils Relative: 0 %
Eosinophils Absolute: 0 K/uL (ref 0.0–0.7)
Eosinophils Relative: 0 %
HCT: 37.3 % (ref 36.0–46.0)
Hemoglobin: 13 g/dL (ref 12.0–15.0)
Lymphocytes Relative: 18 %
Lymphs Abs: 1.8 K/uL (ref 0.7–4.0)
MCH: 30 pg (ref 26.0–34.0)
MCHC: 34.9 g/dL (ref 30.0–36.0)
MCV: 86.1 fL (ref 78.0–100.0)
Monocytes Absolute: 0.8 K/uL (ref 0.1–1.0)
Monocytes Relative: 8 %
Neutro Abs: 7.7 K/uL (ref 1.7–7.7)
Neutrophils Relative %: 74 %
Platelets: 366 K/uL (ref 150–400)
RBC: 4.33 MIL/uL (ref 3.87–5.11)
RDW: 13.7 % (ref 11.5–15.5)
WBC: 10.3 K/uL (ref 4.0–10.5)

## 2016-12-08 LAB — COMPREHENSIVE METABOLIC PANEL
ALBUMIN: 3.8 g/dL (ref 3.5–5.0)
ALT: 19 U/L (ref 14–54)
AST: 24 U/L (ref 15–41)
Alkaline Phosphatase: 51 U/L (ref 38–126)
Anion gap: 12 (ref 5–15)
BUN: 13 mg/dL (ref 6–20)
CHLORIDE: 106 mmol/L (ref 101–111)
CO2: 19 mmol/L — ABNORMAL LOW (ref 22–32)
Calcium: 8.6 mg/dL — ABNORMAL LOW (ref 8.9–10.3)
Creatinine, Ser: 0.91 mg/dL (ref 0.44–1.00)
GFR calc Af Amer: >60 mL/min (ref >60)
GLUCOSE: 114 mg/dL — AB (ref 65–99)
POTASSIUM: 3.1 mmol/L — AB (ref 3.5–5.1)
SODIUM: 137 mmol/L (ref 135–145)
Total Bilirubin: 1.1 mg/dL (ref 0.3–1.2)
Total Protein: 7.2 g/dL (ref 6.5–8.1)

## 2016-12-08 LAB — PREGNANCY, URINE: Preg Test, Ur: NEGATIVE

## 2016-12-08 LAB — LIPASE, BLOOD: Lipase: 22 U/L (ref 11–51)

## 2016-12-08 MED ORDER — ACETAMINOPHEN 325 MG PO TABS
650.0000 mg | ORAL_TABLET | Freq: Once | ORAL | Status: AC
  Administered 2016-12-08: 650 mg via ORAL
  Filled 2016-12-08: qty 2

## 2016-12-08 MED ORDER — SODIUM CHLORIDE 0.9 % IV BOLUS (SEPSIS)
1000.0000 mL | Freq: Once | INTRAVENOUS | Status: AC
  Administered 2016-12-08: 1000 mL via INTRAVENOUS

## 2016-12-08 MED ORDER — KETOROLAC TROMETHAMINE 15 MG/ML IJ SOLN
15.0000 mg | Freq: Once | INTRAMUSCULAR | Status: AC
  Administered 2016-12-08: 15 mg via INTRAVENOUS
  Filled 2016-12-08: qty 1

## 2016-12-08 MED ORDER — ONDANSETRON 4 MG PO TBDP
4.0000 mg | ORAL_TABLET | Freq: Once | ORAL | Status: AC
  Administered 2016-12-08: 4 mg via ORAL
  Filled 2016-12-08: qty 1

## 2016-12-08 NOTE — ED Triage Notes (Signed)
Pt reports dizzy spells, n/v, fever (high of 101) since this past Thursday (reports being seen at that time and that symptoms have worsened); states she was diagnosed with a virus at that time. Pt alert and oriented.

## 2016-12-08 NOTE — ED Notes (Signed)
"  I just left last Thursday and went I went home I was throwing up with bile and it has been nonstop.  When I wake up I throw up.  I have a sharp pain from my ears to my head but it is worst in my right ear than my left ear."

## 2016-12-08 NOTE — ED Provider Notes (Signed)
[facility] Provider Note   CSN: [id number] Arrival date & time: 12/08/16  1634  By signing my name below, I, Emily Davila, attest that this documentation has been prepared under the direction and in the presence of Emily Davila, Emily GarnetPedro Eduardo, MD. Electronically Signed: Rosana Fretana Davila, ED Scribe. 12/08/16. 7:58 PM.  History   Chief Complaint Chief Complaint  Patient presents with  . Emesis  . Fever   The history is provided by the patient. No language interpreter was used.  Fever     HPI Comments: Emily Davila is a 31 y.o. female who presents to the Emergency Department complaining of intermittent, moderate abdominal pain onset 2 days ago. Pt describes pain as a cramping sensation before episodes of emesis. Pt was seen in the ED 2 days ago and was diagnosed with viral illness. Per pt, her associated symptoms have become worse since her last visit. Pt reports associated headache (temporal to ears), photophobia, weakness, diaphoresis, chills, vomiting, fever (101 at home) and generalized myalgias. Pt has tried Tylenol/Motrin with minimal relief. No contact with similar URI-like symptoms. No recent flu shot. Pt denies rhinorrhea, cough, sore throat, congestion, dysuria or any other complaints at this time.  Past Medical History:  Diagnosis Date  . Asthma   . BV (bacterial vaginosis)   . E. coli UTI   . H/O seasonal allergies   . Headache(784.0)   . PID (acute pelvic inflammatory disease)     Patient Active Problem List   Diagnosis Date Noted  . Cholecystitis 05/15/2011    Past Surgical History:  Procedure Laterality Date  . CHOLECYSTECTOMY  04/29/2011   Procedure: LAPAROSCOPIC CHOLECYSTECTOMY WITH INTRAOPERATIVE CHOLANGIOGRAM;  Surgeon: Wilmon ArmsMatthew K. Corliss Skainssuei, MD;  Location: [facility] OR;  Service: General;  Laterality: N/A;  latex allergy, inpatient 5127  . FOOT SURGERY      OB History    No data available       Home Medications    Prior to Admission medications     Medication Sig Start Date End Date Taking? Authorizing Provider  albuterol (PROVENTIL HFA;VENTOLIN HFA) 108 (90 BASE) MCG/ACT inhaler Inhale 2 puffs into the lungs every 4 (four) hours as needed for wheezing or shortness of breath. wheezing 02/28/12  Yes Domenick GongMortenson, Ashley, MD    Family History No family history on file.  Social History Social History  Substance Use Topics  . Smoking status: Former Games developermoker  . Smokeless tobacco: Never Used  . Alcohol use No     Allergies   Latex   Review of Systems Review of Systems All other systems reviewed and are negative for acute change except as noted in the HPI.  Physical Exam Updated Vital Signs BP 123/78 (BP Location: Right Arm)   Pulse 83   Temp (!) 101 F (38.3 C) (Oral)   Resp (!) 24   Ht 5\' 6"  (1.676 m)   Wt 104.3 kg (230 lb)   LMP 11/23/2016   SpO2 100%   BMI 37.12 kg/m   Physical Exam  Constitutional: She is oriented to person, place, and time. She appears well-developed and well-nourished. No distress.  HENT:  Head: Normocephalic and atraumatic.  Nose: Nose normal.  Shotty lymph nodes bilaterally.  Eyes: Conjunctivae and EOM are normal. Pupils are equal, round, and reactive to light. Right eye exhibits no discharge. Left eye exhibits no discharge. No scleral icterus.  Neck: Normal range of motion. Neck supple. Normal range of motion present. No Brudzinski's sign and no Kernig's sign noted.  Cardiovascular: Normal  rate and regular rhythm.  Exam reveals no gallop and no friction rub.   No murmur heard. Pulmonary/Chest: Effort normal and breath sounds normal. No stridor. No respiratory distress. She has no wheezes. She has no rales.  Abdominal: Soft. She exhibits no distension. There is no tenderness.  Musculoskeletal: She exhibits no edema or tenderness.  Neurological: She is alert and oriented to person, place, and time.  Mental Status: Alert and oriented to person, place, and time. Attention and concentration normal.  Speech clear. Recent memory is intact  Cranial Nerves  II Visual Fields: Intact to confrontation. Visual fields intact. III, IV, VI: Pupils equal and reactive to light and near. Full eye movement without nystagmus  V Facial Sensation: Normal. No weakness of masticatory muscles  VII: No facial weakness or asymmetry  VIII Auditory Acuity: Grossly normal  IX/X: The uvula is midline; the palate elevates symmetrically  XI: Normal sternocleidomastoid and trapezius strength  XII: The tongue is midline. No atrophy or fasciculations.   Motor System: Muscle Strength: 5/5 and symmetric in the upper and lower extremities. No pronation or drift.  Muscle Tone: Tone and muscle bulk are normal in the upper and lower extremities.   Reflexes: DTRs: 1+ and symmetrical in all four extremities. Plantar responses are flexor bilaterally.  Coordination: Intact finger-to-nose, heel-to-shin. No tremor.  Sensation: Intact to light touch.  Gait: Routine gait normal.    Skin: Skin is warm and dry. No rash noted. She is not diaphoretic. No erythema.  Psychiatric: She has a normal mood and affect.  Vitals reviewed.  neuro  ED Treatments / Results  DIAGNOSTIC STUDIES: Oxygen Saturation is 100% on RA, normal by my interpretation.   COORDINATION OF CARE: 7:57 PM-Discussed next steps with pt including blood work. Pt verbalized understanding and is agreeable with the plan.   Labs (all labs ordered are listed, but only abnormal results are displayed) Labs Reviewed  URINALYSIS, ROUTINE W REFLEX MICROSCOPIC - Abnormal; Notable for the following:       Result Value   Color, Urine AMBER (*)    APPearance TURBID (*)    Specific Gravity, Urine 1.039 (*)    Hgb urine dipstick SMALL (*)    Bilirubin Urine SMALL (*)    Ketones, ur >80 (*)    Protein, ur 30 (*)    Leukocytes, UA SMALL (*)    All other components within normal limits  URINALYSIS, MICROSCOPIC (REFLEX) - Abnormal; Notable for the following:     Bacteria, UA MANY (*)    Squamous Epithelial / LPF 6-30 (*)    All other components within normal limits  COMPREHENSIVE METABOLIC PANEL - Abnormal; Notable for the following:    Potassium 3.1 (*)    CO2 19 (*)    Glucose, Bld 114 (*)    Calcium 8.6 (*)    All other components within normal limits  PREGNANCY, URINE  CBC WITH DIFFERENTIAL/PLATELET  LIPASE, BLOOD    EKG  EKG Interpretation None       Radiology No results found.  Procedures Procedures (including critical care time)  Medications Ordered in ED Medications  ondansetron (ZOFRAN-ODT) disintegrating tablet 4 mg (4 mg Oral Given 12/08/16 1816)  sodium chloride 0.9 % bolus 1,000 mL (1,000 mLs Intravenous New Bag/Given 12/08/16 2003)  ketorolac (TORADOL) 15 MG/ML injection 15 mg (15 mg Intravenous Given 12/08/16 2003)     Initial Impression / Assessment and Plan / ED Course  I have reviewed the triage vital signs and the nursing notes.  Pertinent labs & imaging results that were available during my care of the patient were reviewed by me and considered in my medical decision making (see chart for details).     Patient is afebrile with stable vital signs, well-hydrated, nontoxic appearing. Presentation is consistent with viral illness. Exam is nonfocal, and no meningeal signs. Don't feel that imaging is warranted at this time. Did obtain screening labs which were stable since her last visit. Epigastric abdominal pain is likely secondary to emesis. Labs without evidence of pancreatitis or biliary obstruction. Abdomen is not peritonitic. I have low suspicion for serious intra-abdominal inflammatory/infectious process.  Patient denied any urinary symptoms however UA was obtained in triage which was grossly contaminated. No indication for treatment at this time.  Patient provided with IV hydration and Toradol. Significant improvement in her symptomatology.  The patient is safe for discharge with strict return  precautions.   Final Clinical Impressions(s) / ED Diagnoses   Final diagnoses:  Viral illness  Bad headache  Myalgia   Disposition: Discharge  Condition: Good  I have discussed the results, Dx and Tx plan with the patient who expressed understanding and agree(s) with the plan. Discharge instructions discussed at great length. The patient was given strict return precautions who verbalized understanding of the instructions. No further questions at time of discharge.    New Prescriptions   No medications on file    Follow Up: Primary care provider  Schedule an appointment as soon as possible for a visit  in 5-7 days, If symptoms do not improve or  worsen   I personally performed the services described in this documentation, which was scribed in my presence. The recorded information has been reviewed and is accurate.        Nira Conn, MD 12/08/16 2145

## 2016-12-08 NOTE — ED Notes (Signed)
Pt reports rotating Tylenol and Motrin at scheduled 4-hr intervals. Last took Motrin around 1630.

## 2016-12-08 NOTE — ED Notes (Signed)
Asked to patient if they could try to give a urine sample. Patient said that she could not because she hasn't had anything to drink. PT. Was told that they could not have any food or drink prior to seeing ED physician.

## 2017-01-03 ENCOUNTER — Encounter: Payer: Self-pay | Admitting: Emergency Medicine

## 2017-01-03 ENCOUNTER — Emergency Department (INDEPENDENT_AMBULATORY_CARE_PROVIDER_SITE_OTHER)
Admission: EM | Admit: 2017-01-03 | Discharge: 2017-01-03 | Disposition: A | Discharge status: Home / Self Care | Payer: Self-pay | Source: Home / Self Care | Attending: Family Medicine | Admitting: Family Medicine

## 2017-01-03 DIAGNOSIS — L03311 Cellulitis of abdominal wall: Secondary | ICD-10-CM

## 2017-01-03 MED ORDER — HYDROCODONE-ACETAMINOPHEN 5-325 MG PO TABS: ORAL_TABLET | ORAL | 0 refills | Status: DC

## 2017-01-03 MED ORDER — DOXYCYCLINE HYCLATE 100 MG PO CAPS: 100.0000 mg | ORAL_CAPSULE | Freq: Two times a day (BID) | ORAL | 0 refills | Status: DC

## 2017-01-03 NOTE — Discharge Instructions (Signed)
Change dressing daily.  Keep wound clean and dry.  Return for any signs of infection (or follow-up with family doctor):  Increasing redness, swelling, pain, heat, drainage, etc. May take Ibuprofen 200mg , 4 tabs every 8 hours with food.

## 2017-01-03 NOTE — ED Triage Notes (Signed)
Pt c/o boil on her abdomen she noticed about 4 days ago... Painful and oozing now.

## 2017-01-03 NOTE — ED Provider Notes (Signed)
[facility]    CSN: [id number] Arrival date & time: 01/03/17  1706     History   Chief Complaint Chief Complaint  Patient presents with  . Recurrent Skin Infections    HPI Emily Davila is a 31 y.o. female.   Patient noticed a "pimple" on her right abdomen five days ago.  She squeezed and expressed some purulent fluid.  The lesion has continued to drain and remains tender to palpation.   The history is provided by the patient.    Past Medical History:  Diagnosis Date  . Asthma   . BV (bacterial vaginosis)   . E. coli UTI   . H/O seasonal allergies   . Headache(784.0)   . PID (acute pelvic inflammatory disease)     Patient Active Problem List   Diagnosis Date Noted  . Cholecystitis 05/15/2011    Past Surgical History:  Procedure Laterality Date  . CHOLECYSTECTOMY  04/29/2011   Procedure: LAPAROSCOPIC CHOLECYSTECTOMY WITH INTRAOPERATIVE CHOLANGIOGRAM;  Surgeon: Wilmon Arms. Corliss Skains, MD;  Location: [facility];  Service: General;  Laterality: N/A;  latex allergy, inpatient 5127  . FOOT SURGERY      OB History    No data available       Home Medications    Prior to Admission medications   Medication Sig Start Date End Date Taking? Authorizing Provider  albuterol (PROVENTIL HFA;VENTOLIN HFA) 108 (90 BASE) MCG/ACT inhaler Inhale 2 puffs into the lungs every 4 (four) hours as needed for wheezing or shortness of breath. wheezing 02/28/12   Domenick Gong, MD  doxycycline (VIBRAMYCIN) 100 MG capsule Take 1 capsule (100 mg total) by mouth 2 (two) times daily. Take with food. 01/03/17   Lattie Haw, MD  HYDROcodone-acetaminophen (NORCO/VICODIN) 5-325 MG tablet Take one by mouth at bedtime as needed for pain 01/03/17   Lattie Haw, MD    Family History History reviewed. No pertinent family history.  Social History Social History  Substance Use Topics  . Smoking status: Former Games developer  . Smokeless tobacco: Never Used  . Alcohol use No      Allergies   Latex   Review of Systems Review of Systems  Constitutional: Negative for activity change, appetite change, chills, diaphoresis, fatigue and fever.  Skin: Positive for wound.  All other systems reviewed and are negative.    Physical Exam Triage Vital Signs ED Triage Vitals  Enc Vitals Group     BP 01/03/17 1731 (!) 131/91     Pulse Rate 01/03/17 1731 80     Resp --      Temp 01/03/17 1731 98.4 F (36.9 C)     Temp Source 01/03/17 1731 Oral     SpO2 01/03/17 1731 100 %     Weight 01/03/17 1733 220 lb (99.8 kg)     Height --      Head Circumference --      Peak Flow --      Pain Score 01/03/17 1734 5     Pain Loc --      Pain Edu? --      Excl. in GC? --    No data found.   Updated Vital Signs BP (!) 131/91 (BP Location: Right Arm)   Pulse 80   Temp 98.4 F (36.9 C) (Oral)   Wt 220 lb (99.8 kg)   SpO2 100%   BMI 35.51 kg/m   Visual Acuity Right Eye Distance:   Left Eye Distance:   Bilateral Distance:  Right Eye Near:   Left Eye Near:    Bilateral Near:     Physical Exam  Constitutional: She appears well-developed and well-nourished. No distress.  HENT:  Head: Normocephalic.  Nose: Nose normal.  Mouth/Throat: Oropharynx is clear and moist.  Eyes: Pupils are equal, round, and reactive to light. Conjunctivae are normal.  Neck: Neck supple.  Cardiovascular: Normal rate.   Pulmonary/Chest: Effort normal.  Abdominal: Soft. There is no tenderness.    Right abdomen has a shallow ulcer about 8mm diameter, with small amount of purulent drainage present.  No swelling.  Granulation tissue present at base.  Minimal surrounding tenderness to palpation.  Lymphadenopathy:    She has no cervical adenopathy.  Neurological: She is alert.  Skin: Skin is warm and dry.  Nursing note and vitals reviewed.    UC Treatments / Results  Labs (all labs ordered are listed, but only abnormal results are displayed) Labs Reviewed - No data to  display  EKG  EKG Interpretation None       Radiology No results found.  Procedures Procedures (including critical care time)  Medications Ordered in UC Medications - No data to display   Initial Impression / Assessment and Plan / UC Course  I have reviewed the triage vital signs and the nursing notes.  Pertinent labs & imaging results that were available during my care of the patient were reviewed by me and considered in my medical decision making (see chart for details).    Wound culture pending.  Begin doxycycline 100mg  BID for staph coverage.  Lortab for pain at bedtime. Change dressing daily.  Keep wound clean and dry.  Return for any signs of infection (or follow-up with family doctor):  Increasing redness, swelling, pain, heat, drainage, etc. May take Ibuprofen 200mg , 4 tabs every 8 hours with food.   Followup with Family Doctor if not improved in one week.     Final Clinical Impressions(s) / UC Diagnoses   Final diagnoses:  Cellulitis of abdominal wall    New Prescriptions New Prescriptions   DOXYCYCLINE (VIBRAMYCIN) 100 MG CAPSULE    Take 1 capsule (100 mg total) by mouth 2 (two) times daily. Take with food.   HYDROCODONE-ACETAMINOPHEN (NORCO/VICODIN) 5-325 MG TABLET    Take one by mouth at bedtime as needed for pain     Lattie HawBeese, Alexianna Nachreiner A, MD 01/04/17 1425

## 2017-01-06 ENCOUNTER — Telehealth: Payer: Self-pay | Admitting: Emergency Medicine

## 2017-01-06 LAB — WOUND CULTURE
GRAM STAIN: NONE SEEN
Gram Stain: NONE SEEN

## 2017-01-06 NOTE — Telephone Encounter (Signed)
Attempted to contact patient first number would not allow a message to be left. The second number the person that answered said that I had the wrong number, the number was double checked, and was the number which was on file.

## 2017-07-12 ENCOUNTER — Encounter (HOSPITAL_BASED_OUTPATIENT_CLINIC_OR_DEPARTMENT_OTHER): Payer: Self-pay

## 2017-07-12 ENCOUNTER — Emergency Department (HOSPITAL_BASED_OUTPATIENT_CLINIC_OR_DEPARTMENT_OTHER)
Admission: EM | Admit: 2017-07-12 | Discharge: 2017-07-12 | Disposition: A | Discharge status: Home / Self Care | Payer: Self-pay | Attending: Emergency Medicine | Admitting: Emergency Medicine

## 2017-07-12 ENCOUNTER — Other Ambulatory Visit: Payer: Self-pay

## 2017-07-12 DIAGNOSIS — Z79899 Other long term (current) drug therapy: Secondary | ICD-10-CM | POA: Insufficient documentation

## 2017-07-12 DIAGNOSIS — J45909 Unspecified asthma, uncomplicated: Secondary | ICD-10-CM | POA: Insufficient documentation

## 2017-07-12 DIAGNOSIS — B356 Tinea cruris: Secondary | ICD-10-CM | POA: Insufficient documentation

## 2017-07-12 DIAGNOSIS — N760 Acute vaginitis: Secondary | ICD-10-CM | POA: Insufficient documentation

## 2017-07-12 DIAGNOSIS — B3731 Acute candidiasis of vulva and vagina: Secondary | ICD-10-CM

## 2017-07-12 DIAGNOSIS — Z87891 Personal history of nicotine dependence: Secondary | ICD-10-CM | POA: Insufficient documentation

## 2017-07-12 DIAGNOSIS — B373 Candidiasis of vulva and vagina: Secondary | ICD-10-CM | POA: Insufficient documentation

## 2017-07-12 DIAGNOSIS — B9689 Other specified bacterial agents as the cause of diseases classified elsewhere: Secondary | ICD-10-CM | POA: Insufficient documentation

## 2017-07-12 DIAGNOSIS — N764 Abscess of vulva: Secondary | ICD-10-CM

## 2017-07-12 LAB — URINALYSIS, ROUTINE W REFLEX MICROSCOPIC
BILIRUBIN URINE: NEGATIVE
GLUCOSE, UA: NEGATIVE mg/dL
Ketones, ur: 15 mg/dL — AB
Leukocytes, UA: NEGATIVE
Nitrite: NEGATIVE
PH: 6.5 (ref 5.0–8.0)
Protein, ur: NEGATIVE mg/dL
SPECIFIC GRAVITY, URINE: 1.025 (ref 1.005–1.030)

## 2017-07-12 LAB — WET PREP, GENITAL
SPERM: NONE SEEN
Trich, Wet Prep: NONE SEEN

## 2017-07-12 LAB — PREGNANCY, URINE: Preg Test, Ur: NEGATIVE

## 2017-07-12 LAB — URINALYSIS, MICROSCOPIC (REFLEX)

## 2017-07-12 LAB — CBG MONITORING, ED: GLUCOSE-CAPILLARY: 104 mg/dL — AB (ref 65–99)

## 2017-07-12 MED ORDER — KETOCONAZOLE 2 % EX CREA: 1.0000 "application " | TOPICAL_CREAM | Freq: Every day | CUTANEOUS | 0 refills | Status: DC

## 2017-07-12 MED ORDER — LIDOCAINE-EPINEPHRINE 2 %-1:100000 IJ SOLN
20.0000 mL | Freq: Once | INTRAMUSCULAR | Status: DC
  Filled 2017-07-12: qty 1

## 2017-07-12 MED ORDER — ONDANSETRON 4 MG PO TBDP
4.0000 mg | ORAL_TABLET | Freq: Once | ORAL | Status: AC
  Administered 2017-07-12: 4 mg via ORAL
  Filled 2017-07-12: qty 1

## 2017-07-12 MED ORDER — FLUCONAZOLE 100 MG PO TABS
150.0000 mg | ORAL_TABLET | Freq: Once | ORAL | Status: AC
  Administered 2017-07-12: 150 mg via ORAL
  Filled 2017-07-12: qty 1

## 2017-07-12 MED ORDER — METRONIDAZOLE 500 MG PO TABS
2000.0000 mg | ORAL_TABLET | Freq: Once | ORAL | Status: AC
  Administered 2017-07-12: 2000 mg via ORAL
  Filled 2017-07-12: qty 4

## 2017-07-12 NOTE — ED Provider Notes (Signed)
[facility] EMERGENCY DEPARTMENT Provider Note   CSN: [id number] Arrival date & time: 07/12/17  1705     History   Chief Complaint Chief Complaint  Patient presents with  . Vaginal Itching    HPI Emily Davila is a 32 y.o. female female who presents for vaginal itching and labial swelling. Patient thinks she may have BV which she has had in the past. She has had labial itching and thinks she may have scratched her labia and has an abscess. She has a single female partner of 4-1/2 years.  They use nonlatex condoms because she is allergic.  She did use a sex toy about a month ago before this began and is unsure if that contained laytex.  She denies history of the same.  HPI  Past Medical History:  Diagnosis Date  . Asthma   . BV (bacterial vaginosis)   . E. coli UTI   . H/O seasonal allergies   . Headache(784.0)   . PID (acute pelvic inflammatory disease)     Patient Active Problem List   Diagnosis Date Noted  . Cholecystitis 05/15/2011    Past Surgical History:  Procedure Laterality Date  . CHOLECYSTECTOMY  04/29/2011   Procedure: LAPAROSCOPIC CHOLECYSTECTOMY WITH INTRAOPERATIVE CHOLANGIOGRAM;  Surgeon: Wilmon ArmsMatthew K. Corliss Skainssuei, MD;  Location: [facility] OR;  Service: General;  Laterality: N/A;  latex allergy, inpatient 5127  . FOOT SURGERY      OB History    No data available       Home Medications    Prior to Admission medications   Medication Sig Start Date End Date Taking? Authorizing Provider  albuterol (PROVENTIL HFA;VENTOLIN HFA) 108 (90 BASE) MCG/ACT inhaler Inhale 2 puffs into the lungs every 4 (four) hours as needed for wheezing or shortness of breath. wheezing 02/28/12   Domenick GongMortenson, Ashley, MD  doxycycline (VIBRAMYCIN) 100 MG capsule Take 1 capsule (100 mg total) by mouth 2 (two) times daily. Take with food. 01/03/17   Lattie HawBeese, Stephen A, MD  HYDROcodone-acetaminophen (NORCO/VICODIN) 5-325 MG tablet Take one by mouth at bedtime as needed for pain 01/03/17   Lattie HawBeese,  Stephen A, MD    Family History No family history on file.  Social History Social History   Tobacco Use  . Smoking status: Former Games developermoker  . Smokeless tobacco: Never Used  Substance Use Topics  . Alcohol use: No  . Drug use: No     Allergies   Latex   Review of Systems Review of Systems   Physical Exam Updated Vital Signs Ht 5\' 6"  (1.676 m)   Wt 104.3 kg (230 lb)   LMP 06/27/2017   BMI 37.12 kg/m   Physical Exam  Constitutional: She is oriented to person, place, and time. She appears well-developed and well-nourished. No distress.  HENT:  Head: Normocephalic and atraumatic.  Eyes: Conjunctivae are normal. No scleral icterus.  Neck: Normal range of motion.  Cardiovascular: Normal rate, regular rhythm and normal heart sounds. Exam reveals no gallop and no friction rub.  No murmur heard. Pulmonary/Chest: Effort normal and breath sounds normal. No respiratory distress.  Abdominal: Soft. Bowel sounds are normal. She exhibits no distension and no mass. There is no tenderness. There is no guarding.  Genitourinary:  Genitourinary Comments: Normal-appearing female genitalia.  Patient has a ulcerated tender wound on the right superior labia, medial on the inner labia there is a small tender indurated region with a pustular head.  There is a firm 2 cm very tender and indurated region of  the left upper labia.  Vaginal walls are pink with out evidence of abnormal discharge, nulliparous os without cervical discharge.  No cervical motion tenderness, no adnexal tenderness.   Neurological: She is alert and oriented to person, place, and time.  Skin: Skin is warm and dry. She is not diaphoretic.  Psychiatric: Her behavior is normal.  Nursing note and vitals reviewed.    ED Treatments / Results  Labs (all labs ordered are listed, but only abnormal results are displayed) Labs Reviewed  URINALYSIS, ROUTINE W REFLEX MICROSCOPIC - Abnormal; Notable for the following components:       Result Value   APPearance CLOUDY (*)    Hgb urine dipstick TRACE (*)    Ketones, ur 15 (*)    All other components within normal limits  URINALYSIS, MICROSCOPIC (REFLEX) - Abnormal; Notable for the following components:   Bacteria, UA MANY (*)    Squamous Epithelial / LPF 6-30 (*)    All other components within normal limits  WET PREP, GENITAL  PREGNANCY, URINE  RPR  HIV ANTIBODY (ROUTINE TESTING)  GC/CHLAMYDIA PROBE AMP ([place]) NOT AT [facility]    EKG  EKG Interpretation None       Radiology No results found.  Procedures Procedures (including critical care time)   INCISION AND DRAINAGE Performed by: Arthor Captain Consent: Verbal consent obtained. Risks and benefits: risks, benefits and alternatives were discussed Type: abscess  Body area: Labia  Anesthesia: local infiltration  Incision was made with a scalpel.  Local anesthetic: lidocaine 2% w epinephrine  Anesthetic total: 5 ml  Complexity: complex Blunt dissection to break up loculations  Drainage: purulent  Drainage amount: moderate   Patient tolerance: Patient tolerated the procedure well with no immediate complications.  & Medications Ordered in ED Medications  lidocaine-EPINEPHrine (XYLOCAINE W/EPI) 2 %-1:100000 (with pres) injection 20 mL (not administered)     Initial Impression / Assessment and Plan / ED Course  I have reviewed the triage vital signs and the nursing notes.  Pertinent labs & imaging results that were available during my care of the patient were reviewed by me and considered in my medical decision making (see chart for details).     Patient with apparent tinea cruris and secondary labial abscess.  Will treat for yeast vaginitis BV. Patient is to f/u with ob/gyn. Discussed return precautions.  Final Clinical Impressions(s) / ED Diagnoses   Final diagnoses:  None    ED Discharge Orders    None       Arthor Captain, PA-C 07/13/17 4098    Gwyneth Sprout, MD 07/13/17 1113

## 2017-07-12 NOTE — Discharge Instructions (Signed)
Contact a health care provider if: °Your cyst or abscess returns. °You have a fever. °You have more redness, swelling, or pain around your incision. °You have more fluid or blood coming from your incision. °Your incision feels warm to the touch. °You have pus or a bad smell coming from your incision. °Get help right away if: °You have severe pain or bleeding. °You cannot eat or drink without vomiting. °You have decreased urine output. °You become short of breath. °You have chest pain. °You cough up blood. °The area where the incision and drainage occurred becomes numb or it tingles °

## 2017-07-12 NOTE — ED Triage Notes (Signed)
Pt c/o vaginal itching, burning and discomfort for the last few days, and pt has been scratching and caused an abscess to the lip of her vagina with pus drainage, pt has not tried any OTC meds today for pain

## 2017-07-13 LAB — RPR: RPR Ser Ql: NONREACTIVE

## 2017-07-13 LAB — HIV ANTIBODY (ROUTINE TESTING W REFLEX): HIV Screen 4th Generation wRfx: NONREACTIVE

## 2017-07-15 LAB — GC/CHLAMYDIA PROBE AMP (~~LOC~~) NOT AT ARMC
CHLAMYDIA, DNA PROBE: NEGATIVE
NEISSERIA GONORRHEA: NEGATIVE

## 2017-12-12 ENCOUNTER — Emergency Department (HOSPITAL_BASED_OUTPATIENT_CLINIC_OR_DEPARTMENT_OTHER): Payer: Self-pay

## 2017-12-12 ENCOUNTER — Other Ambulatory Visit: Payer: Self-pay

## 2017-12-12 ENCOUNTER — Encounter (HOSPITAL_BASED_OUTPATIENT_CLINIC_OR_DEPARTMENT_OTHER): Payer: Self-pay | Admitting: *Deleted

## 2017-12-12 ENCOUNTER — Emergency Department (HOSPITAL_BASED_OUTPATIENT_CLINIC_OR_DEPARTMENT_OTHER)
Admission: EM | Admit: 2017-12-12 | Discharge: 2017-12-12 | Disposition: A | Discharge status: Home / Self Care | Payer: Self-pay | Attending: Emergency Medicine | Admitting: Emergency Medicine

## 2017-12-12 DIAGNOSIS — M25562 Pain in left knee: Secondary | ICD-10-CM | POA: Insufficient documentation

## 2017-12-12 DIAGNOSIS — J45909 Unspecified asthma, uncomplicated: Secondary | ICD-10-CM | POA: Insufficient documentation

## 2017-12-12 DIAGNOSIS — N10 Acute tubulo-interstitial nephritis: Secondary | ICD-10-CM | POA: Insufficient documentation

## 2017-12-12 DIAGNOSIS — R1011 Right upper quadrant pain: Secondary | ICD-10-CM | POA: Insufficient documentation

## 2017-12-12 DIAGNOSIS — N12 Tubulo-interstitial nephritis, not specified as acute or chronic: Secondary | ICD-10-CM

## 2017-12-12 DIAGNOSIS — T1490XA Injury, unspecified, initial encounter: Secondary | ICD-10-CM

## 2017-12-12 DIAGNOSIS — Z79899 Other long term (current) drug therapy: Secondary | ICD-10-CM | POA: Insufficient documentation

## 2017-12-12 DIAGNOSIS — Z87891 Personal history of nicotine dependence: Secondary | ICD-10-CM | POA: Insufficient documentation

## 2017-12-12 LAB — CBC WITH DIFFERENTIAL/PLATELET
Basophils Absolute: 0 10*3/uL (ref 0.0–0.1)
Basophils Relative: 0 %
Eosinophils Absolute: 0.1 10*3/uL (ref 0.0–0.7)
Eosinophils Relative: 1 %
HCT: 35.1 % — ABNORMAL LOW (ref 36.0–46.0)
Hemoglobin: 12.1 g/dL (ref 12.0–15.0)
Lymphocytes Relative: 38 %
Lymphs Abs: 3.1 10*3/uL (ref 0.7–4.0)
MCH: 30.4 pg (ref 26.0–34.0)
MCHC: 34.5 g/dL (ref 30.0–36.0)
MCV: 88.2 fL (ref 78.0–100.0)
Monocytes Absolute: 0.6 10*3/uL (ref 0.1–1.0)
Monocytes Relative: 7 %
Neutro Abs: 4.3 10*3/uL (ref 1.7–7.7)
Neutrophils Relative %: 54 %
Platelets: 333 10*3/uL (ref 150–400)
RBC: 3.98 MIL/uL (ref 3.87–5.11)
RDW: 13.6 % (ref 11.5–15.5)
WBC: 8 10*3/uL (ref 4.0–10.5)

## 2017-12-12 LAB — URINALYSIS, ROUTINE W REFLEX MICROSCOPIC
Bilirubin Urine: NEGATIVE
Glucose, UA: NEGATIVE mg/dL
Ketones, ur: NEGATIVE mg/dL
Leukocytes, UA: NEGATIVE
Nitrite: POSITIVE — AB
Protein, ur: NEGATIVE mg/dL
Specific Gravity, Urine: 1.025 (ref 1.005–1.030)
pH: 6 (ref 5.0–8.0)

## 2017-12-12 LAB — COMPREHENSIVE METABOLIC PANEL
ALT: 14 U/L (ref 14–54)
AST: 16 U/L (ref 15–41)
Albumin: 3.8 g/dL (ref 3.5–5.0)
Alkaline Phosphatase: 75 U/L (ref 38–126)
Anion gap: 4 — ABNORMAL LOW (ref 5–15)
BUN: 13 mg/dL (ref 6–20)
CO2: 26 mmol/L (ref 22–32)
Calcium: 8.8 mg/dL — ABNORMAL LOW (ref 8.9–10.3)
Chloride: 108 mmol/L (ref 101–111)
Creatinine, Ser: 0.72 mg/dL (ref 0.44–1.00)
GFR calc Af Amer: >60 mL/min (ref >60)
GFR calc non Af Amer: >60 mL/min (ref >60)
Glucose, Bld: 104 mg/dL — ABNORMAL HIGH (ref 65–99)
Potassium: 3.9 mmol/L (ref 3.5–5.1)
Sodium: 138 mmol/L (ref 135–145)
Total Bilirubin: 0.4 mg/dL (ref 0.3–1.2)
Total Protein: 7 g/dL (ref 6.5–8.1)

## 2017-12-12 LAB — PREGNANCY, URINE: Preg Test, Ur: NEGATIVE

## 2017-12-12 LAB — URINALYSIS, MICROSCOPIC (REFLEX)

## 2017-12-12 MED ORDER — NAPROXEN 500 MG PO TABS: 500.0000 mg | ORAL_TABLET | Freq: Two times a day (BID) | ORAL | 0 refills | Status: DC

## 2017-12-12 MED ORDER — ACETAMINOPHEN 500 MG PO TABS: 500.0000 mg | ORAL_TABLET | Freq: Four times a day (QID) | ORAL | 0 refills | Status: DC | PRN

## 2017-12-12 MED ORDER — CEPHALEXIN 500 MG PO CAPS: 500.0000 mg | ORAL_CAPSULE | Freq: Four times a day (QID) | ORAL | 0 refills | Status: AC

## 2017-12-12 MED FILL — ACETAMINOPHEN 500 MG TABS: 500 | 25 days supply | Qty: 100 | Fill #0

## 2017-12-12 MED FILL — CEPHALEXIN 500 MG CAPSULE: 500 | 14 days supply | Qty: 56 | Fill #0

## 2017-12-12 MED FILL — NAPROXEN 500 MG TABLET: 500 | 15 days supply | Qty: 30 | Fill #0

## 2017-12-12 NOTE — ED Provider Notes (Signed)
[facility] EMERGENCY DEPARTMENT Provider Note   CSN: [id number] Arrival date & time: 12/12/17  1016     History   Chief Complaint Chief Complaint  Patient presents with  . Knee Pain    HPI Emily Davila is a 32 y.o. female with history of asthma who presents with a 2-week history of left knee pain and one-week history of right thoracic back pain.  Patient reports hitting her left knee on a sharp table 2 weeks ago and has had pain to her patella with bearing weight since.  She reports right sided back pain that began a week ago.  She thought that she may have been dehydrated and started drinking more water which has increased her urinary frequency.  She denies any dysuria.  She denies any abnormal bleeding or discharge, abdominal pain, chest pain, shortness of breath, nausea, vomiting, fevers.  She denies any known cancer, history of procedure to her back, IV drug use.  She has been taking ibuprofen at home without relief. She has no history of kidney stones.  HPI  Past Medical History:  Diagnosis Date  . Asthma   . BV (bacterial vaginosis)   . E. coli UTI   . H/O seasonal allergies   . Headache(784.0)   . PID (acute pelvic inflammatory disease)     Patient Active Problem List   Diagnosis Date Noted  . Cholecystitis 05/15/2011    Past Surgical History:  Procedure Laterality Date  . CHOLECYSTECTOMY  04/29/2011   Procedure: LAPAROSCOPIC CHOLECYSTECTOMY WITH INTRAOPERATIVE CHOLANGIOGRAM;  Surgeon: Wilmon Arms. Corliss Skains, MD;  Location: [facility] OR;  Service: General;  Laterality: N/A;  latex allergy, inpatient 5127  . FOOT SURGERY       OB History   None      Home Medications    Prior to Admission medications   Medication Sig Start Date End Date Taking? Authorizing Provider  acetaminophen (TYLENOL) 500 MG tablet Take 1 tablet (500 mg total) by mouth every 6 (six) hours as needed. 12/12/17   Charita Lindenberger, Waylan Boga, PA-C  albuterol (PROVENTIL HFA;VENTOLIN HFA) 108 (90 BASE)  MCG/ACT inhaler Inhale 2 puffs into the lungs every 4 (four) hours as needed for wheezing or shortness of breath. wheezing 02/28/12   Domenick Gong, MD  cephALEXin (KEFLEX) 500 MG capsule Take 1 capsule (500 mg total) by mouth 4 (four) times daily for 14 days. 12/12/17 12/26/17  Emi Holes, PA-C  doxycycline (VIBRAMYCIN) 100 MG capsule Take 1 capsule (100 mg total) by mouth 2 (two) times daily. Take with food. 01/03/17   Lattie Haw, MD  HYDROcodone-acetaminophen (NORCO/VICODIN) 5-325 MG tablet Take one by mouth at bedtime as needed for pain 01/03/17   Lattie Haw, MD  ketoconazole (NIZORAL) 2 % cream Apply 1 application topically daily. 07/12/17   Harris, Cammy Copa, PA-C  naproxen (NAPROSYN) 500 MG tablet Take 1 tablet (500 mg total) by mouth 2 (two) times daily. 12/12/17   Emi Holes, PA-C    Family History History reviewed. No pertinent family history.  Social History Social History   Tobacco Use  . Smoking status: Former Games developer  . Smokeless tobacco: Never Used  Substance Use Topics  . Alcohol use: No  . Drug use: No     Allergies   Latex   Review of Systems Review of Systems  Constitutional: Negative for chills and fever.  HENT: Negative for facial swelling and sore throat.   Respiratory: Negative for shortness of breath.   Cardiovascular: Negative for  chest pain.  Gastrointestinal: Negative for abdominal pain, nausea and vomiting.  Genitourinary: Positive for frequency. Negative for dysuria.  Musculoskeletal: Positive for arthralgias and back pain.  Skin: Negative for rash and wound.  Neurological: Negative for headaches.  Psychiatric/Behavioral: The patient is not nervous/anxious.      Physical Exam Updated Vital Signs BP 122/80 (BP Location: Right Arm)   Pulse 71   Temp 98 F (36.7 C) (Oral)   Resp 18   Ht 5\' 5"  (1.651 m)   Wt 104.3 kg (230 lb)   LMP 11/11/2017 Comment: negative u-preg today  SpO2 98%   BMI 38.27 kg/m   Physical Exam    Constitutional: She appears well-developed and well-nourished. No distress.  HENT:  Head: Normocephalic and atraumatic.  Mouth/Throat: Oropharynx is clear and moist. No oropharyngeal exudate.  Eyes: Pupils are equal, round, and reactive to light. Conjunctivae are normal. Right eye exhibits no discharge. Left eye exhibits no discharge. No scleral icterus.  Neck: Normal range of motion. Neck supple. No thyromegaly present.  Cardiovascular: Normal rate, regular rhythm, normal heart sounds and intact distal pulses. Exam reveals no gallop and no friction rub.  No murmur heard. Pulmonary/Chest: Effort normal and breath sounds normal. No stridor. No respiratory distress. She has no wheezes. She has no rales.  Abdominal: Soft. Bowel sounds are normal. She exhibits no distension. There is no tenderness. There is CVA tenderness. There is no rebound and no guarding.  Musculoskeletal: She exhibits no edema.       Left knee: She exhibits bony tenderness. She exhibits no swelling, no deformity, normal alignment, no LCL laxity and no MCL laxity. Tenderness (patella) found. No medial joint line, no lateral joint line, no MCL and no LCL tenderness noted.  No midline cervical, thoracic, or lumbar tenderness Tenderness only over right CVA Tenderness over L patella only, SLR intact, no warmth or erythema  Lymphadenopathy:    She has no cervical adenopathy.  Neurological: She is alert. Coordination normal.  Skin: Skin is warm and dry. No rash noted. She is not diaphoretic. No pallor.  Psychiatric: She has a normal mood and affect.  Nursing note and vitals reviewed.    ED Treatments / Results  Labs (all labs ordered are listed, but only abnormal results are displayed) Labs Reviewed  URINALYSIS, ROUTINE W REFLEX MICROSCOPIC - Abnormal; Notable for the following components:      Result Value   APPearance CLOUDY (*)    Hgb urine dipstick SMALL (*)    Nitrite POSITIVE (*)    All other components within  normal limits  CBC WITH DIFFERENTIAL/PLATELET - Abnormal; Notable for the following components:   HCT 35.1 (*)    All other components within normal limits  COMPREHENSIVE METABOLIC PANEL - Abnormal; Notable for the following components:   Glucose, Bld 104 (*)    Calcium 8.8 (*)    Anion gap 4 (*)    All other components within normal limits  URINALYSIS, MICROSCOPIC (REFLEX) - Abnormal; Notable for the following components:   Bacteria, UA MANY (*)    All other components within normal limits  URINE CULTURE  PREGNANCY, URINE    EKG None  Radiology Dg Knee 4 Views W/patella Left  Result Date: 12/12/2017 CLINICAL DATA:  Pain and swelling. EXAM: LEFT KNEE - COMPLETE 4+ VIEW COMPARISON:  No recent. FINDINGS: No acute bony. No evidence of fracture or dislocation. Moderate knee joint effusion. IMPRESSION: No acute bony abnormality. Mild degenerative change. Moderate knee joint effusion. Electronically Signed  ByMaisie Fus  Register   On: 12/12/2017 11:21   Ct Renal Stone Study  Result Date: 12/12/2017 CLINICAL DATA:  History of right flank pain for 1 week, microscopic hematuria EXAM: CT ABDOMEN AND PELVIS WITHOUT CONTRAST TECHNIQUE: Multidetector CT imaging of the abdomen and pelvis was performed following the standard protocol without IV contrast. COMPARISON:  None. FINDINGS: Lower chest: The lung bases are clear. The heart is within normal limits in size. Hepatobiliary: The liver is unremarkable on this unenhanced study. Surgical clips are present from prior cholecystectomy. Pancreas: The pancreas is normal in size. The pancreatic duct is not dilated. Spleen: The spleen is unremarkable. Adrenals/Urinary Tract: The adrenal glands appear normal. No renal calculi are seen. There is no evidence of hydronephrosis. The ureters appear caliber. The urinary bladder is not well distended but no abnormality is seen. Stomach/Bowel: Stomach is not well distended. No small bowel abnormality is seen. There are a  few scattered rectosigmoid colon diverticula in the rectosigmoid colon is somewhat tortuous. The terminal ileum is unremarkable the appendix is faintly visualized, being unremarkable. Vascular/Lymphatic: The abdominal aorta is normal in caliber. No adenopathy is seen. Reproductive: The uterus is normal in size. There is a left adnexal cyst present 3.8 cm. No free fluid is seen within the pelvis. Other: No abdominal wall hernia is seen. Musculoskeletal: The lumbar vertebrae are in normal alignment with normal intervertebral disc spaces. The SI joints appear corticated. IMPRESSION: 1. No renal or ureteral calculi are seen. No hydronephrosis is noted. 2. There is a left ovarian cyst present of 4.0 x 3.8 cm which may account for some of the patient's pain. No free fluid is seen. Electronically Signed   By: Dwyane Dee M.D.   On: 12/12/2017 12:32    Procedures Procedures (including critical care time)  Medications Ordered in ED Medications - No data to display   Initial Impression / Assessment and Plan / ED Course  I have reviewed the triage vital signs and the nursing notes.  Pertinent labs & imaging results that were available during my care of the patient were reviewed by me and considered in my medical decision making (see chart for details).     Patient with left knee pain after hitting it on a table.  X-ray is negative for acute bony abnormality, moderate knee joint effusion, mild degenerative change.  No signs of septic joint.  Will treat supportively with ice, elevation, knee sleeve.  Will refer to sports medicine for further evaluation.  Patient also with suspected pyelonephritis.  CBC, CMP unremarkable.  UA shows positive nitrites, small hematuria, many bacteria.  Urine culture sent.  Patient has CVA tenderness.  Urine pregnancy is negative.  CT renal stone conducted to rule out stone as cause of hematuria which was negative for stone or hydronephrosis, but did show a left ovarian cyst.  This  is asymptomatic as patient has no pain or tenderness there.  Will treat with Keflex and will discharge home with Naprosyn and Tylenol for pain control.  Return precautions discussed.  Patient understands and agrees with plan.  Patient vitals stable throughout ED course and discharged in satisfactory condition.  Final Clinical Impressions(s) / ED Diagnoses   Final diagnoses:  Acute pain of left knee  Pyelonephritis    ED Discharge Orders        Ordered    cephALEXin (KEFLEX) 500 MG capsule  4 times daily     12/12/17 1312    naproxen (NAPROSYN) 500 MG tablet  2 times  daily     12/12/17 1312    acetaminophen (TYLENOL) 500 MG tablet  Every 6 hours PRN     12/12/17 [address] PA-C 12/12/17 1541    Mesner, Barbara Cower, MD 12/13/17 1846

## 2017-12-12 NOTE — Discharge Instructions (Addendum)
Medications: Keflex, Naprosyn, Tylenol  Treatment: Take Keflex 4 times daily for 14 days.  Take Naprosyn twice daily as needed for your pain.  You can alternate with Tylenol.  Use ice to your knee 3-4 times daily alternating 20 minutes on, 20 minutes off.  Wear your brace as needed especially with walking.  Follow-up: Please follow-up with Dr. Pearletha ForgeHudnall with sports medicine for further evaluation and treatment.  Please return to the emergency department if you develop any new or worsening symptoms including fever, severe worsening pain, intractable vomiting, or any other new or concerning symptoms.

## 2017-12-12 NOTE — ED Triage Notes (Signed)
Pt reports left knee pain x 1 week, no injury or trauma, now states that her back is hurting as well, no injury or trauma.

## 2017-12-14 LAB — URINE CULTURE: Culture: >=100000 — AB

## 2017-12-15 ENCOUNTER — Telehealth: Payer: Self-pay

## 2017-12-15 NOTE — Telephone Encounter (Signed)
Post ED Visit - Positive Culture Follow-up  Culture report reviewed by antimicrobial stewardship pharmacist:  []  Emily Davila, Pharm.D. []  Emily Davila, [address] BCPS AQ-ID []  Emily Davila, Pharm.D., BCPS []  Emily Davila, [address] BCPS []  Emily Davila, [address] BCPS, AAHIVP []  Emily Davila, Pharm.D., BCPS, AAHIVP []  Emily Davila, PharmD, BCPS []  Emily Davila, PharmD []  Emily Davila, PharmD, BCPS Emily Davila Pharm D Positive urine culture Treated with Cephalexin, organism sensitive to the same and no further patient follow-up is required at this time.  Emily Davila, Emily Davila 12/15/2017, 9:59 AM

## 2018-05-05 ENCOUNTER — Encounter (HOSPITAL_BASED_OUTPATIENT_CLINIC_OR_DEPARTMENT_OTHER): Payer: Self-pay | Admitting: Emergency Medicine

## 2018-05-05 ENCOUNTER — Other Ambulatory Visit: Payer: Self-pay

## 2018-05-05 ENCOUNTER — Emergency Department (HOSPITAL_BASED_OUTPATIENT_CLINIC_OR_DEPARTMENT_OTHER)
Admission: EM | Admit: 2018-05-05 | Discharge: 2018-05-05 | Disposition: A | Discharge status: Home / Self Care | Payer: Self-pay | Attending: Emergency Medicine | Admitting: Emergency Medicine

## 2018-05-05 DIAGNOSIS — Z87891 Personal history of nicotine dependence: Secondary | ICD-10-CM | POA: Insufficient documentation

## 2018-05-05 DIAGNOSIS — J45998 Other asthma: Secondary | ICD-10-CM | POA: Insufficient documentation

## 2018-05-05 DIAGNOSIS — M273 Alveolitis of jaws: Secondary | ICD-10-CM | POA: Insufficient documentation

## 2018-05-05 MED ORDER — CLINDAMYCIN HCL 300 MG PO CAPS: 300.0000 mg | ORAL_CAPSULE | Freq: Four times a day (QID) | ORAL | 0 refills | Status: DC

## 2018-05-05 MED ORDER — KETOROLAC TROMETHAMINE 60 MG/2ML IM SOLN
60.0000 mg | Freq: Once | INTRAMUSCULAR | Status: AC
  Administered 2018-05-05: 60 mg via INTRAMUSCULAR
  Filled 2018-05-05: qty 2

## 2018-05-05 MED ORDER — OXYCODONE-ACETAMINOPHEN 5-325 MG PO TABS: 2.0000 | ORAL_TABLET | Freq: Three times a day (TID) | ORAL | 0 refills | Status: DC | PRN

## 2018-05-05 MED FILL — OXYCODONE-ACETAMINOPHEN 5-3: 5-325 | 2 days supply | Qty: 10 | Fill #0

## 2018-05-05 MED FILL — CLINDAMYCIN HCL 300 MG CAP: 300 | 7 days supply | Qty: 28 | Fill #0

## 2018-05-05 NOTE — ED Triage Notes (Signed)
Pt had tooth pulled one week ago.  Pt continues to have pain and some swelling.  OTC meds not helping.

## 2018-05-05 NOTE — ED Provider Notes (Signed)
Emergency Department Provider Note   I have reviewed the triage vital signs and the nursing notes.   HISTORY  Chief Complaint Dental Problem   HPI Emily Davila is a 32 y.o. female with history as documented below the presents to the emergency department today with jaw pain after a dental procedure.  She had her left lower first molar removed a week ago after the first day or 2 was doing fine with ibuprofen and Tylenol but then subsequently noted having worsening swelling and pain in her jaw so she called the dentist who stated this was normal for the first few days however progressively worsened over the weekend and she was having bowel odorous discharge, "nasty taste "in her mouth and worsening swelling and pain not relieved by salt water washes, Listerine, ibuprofen and Tylenol.  Presents here for further evaluation.  No trouble speaking, swallowing or breathing.  No fevers chills nausea or vomiting. No other associated or modifying symptoms.    Past Medical History:  Diagnosis Date  . Asthma   . BV (bacterial vaginosis)   . E. coli UTI   . H/O seasonal allergies   . Headache(784.0)   . PID (acute pelvic inflammatory disease)     Patient Active Problem List   Diagnosis Date Noted  . Cholecystitis 05/15/2011    Past Surgical History:  Procedure Laterality Date  . CHOLECYSTECTOMY  04/29/2011   Procedure: LAPAROSCOPIC CHOLECYSTECTOMY WITH INTRAOPERATIVE CHOLANGIOGRAM;  Surgeon: Wilmon Arms. Corliss Skains, MD;  Location: [facility] OR;  Service: General;  Laterality: N/A;  latex allergy, inpatient 5127  . FOOT SURGERY      Current Outpatient Rx  . Order #: [id number] Class: Print  . Order #: [id number] Class: Print  . Order #: [id number] Class: Print  . Order #: [id number] Class: Print  . Order #: [id number] Class: Print    Allergies Latex and Keflex [cephalexin]  No family history on file.  Social History Social History   Tobacco Use  . Smoking status: Former Games developer  . Smokeless  tobacco: Never Used  Substance Use Topics  . Alcohol use: No  . Drug use: No    Review of Systems  All other systems negative except as documented in the HPI. All pertinent positives and negatives as reviewed in the HPI. ____________________________________________   PHYSICAL EXAM:  VITAL SIGNS: ED Triage Vitals  Enc Vitals Group     BP 05/05/18 0734 (!) 144/79     Pulse Rate 05/05/18 0734 85     Resp --      Temp 05/05/18 0734 98.7 F (37.1 C)     Temp Source 05/05/18 0734 Oral     SpO2 05/05/18 0734 100 %     Weight 05/05/18 0735 230 lb (104.3 kg)     Height 05/05/18 0735 5\' 6"  (1.676 m)     Head Circumference --      Peak Flow --      Pain Score 05/05/18 0734 8     Pain Loc --      Pain Edu? --      Excl. in GC? --     Constitutional: Alert and oriented. Well appearing and in no acute distress. Eyes: Conjunctivae are normal. PERRL. EOMI. Head: Atraumatic. Nose: No congestion/rhinnorhea. Mouth/Throat: Mucous membranes are moist.  Oropharynx non-erythematous.  Patient has an empty socket with a purulent appearing brain associated with gingival erythema and swelling in area of recent tooth extraction. Neck: No stridor.  No meningeal signs.   Cardiovascular: Normal rate, regular rhythm. Good  peripheral circulation. Grossly normal heart sounds.   Respiratory: Normal respiratory effort.  No retractions. Lungs CTAB. Gastrointestinal: Soft and nontender. No distention.  Musculoskeletal: No lower extremity tenderness nor edema. No gross deformities of extremities. Neurologic:  Normal speech and language. No gross focal neurologic deficits are appreciated.  Skin:  Skin is warm, dry and intact. No rash noted.  ____________________________________________   INITIAL IMPRESSION / ASSESSMENT AND PLAN / ED COURSE  Signs and symptoms consistent with dry socket without evidence of deep neck space infection, Ludwig's or other complication.  We will start antibiotics and increase  her pain control for a few days and have her follow-up with the dentist for further management as needed.  Pertinent labs & imaging results that were available during my care of the patient were reviewed by me and considered in my medical decision making (see chart for details).  ____________________________________________  FINAL CLINICAL IMPRESSION(S) / ED DIAGNOSES  Final diagnoses:  Dry tooth socket     MEDICATIONS GIVEN DURING THIS VISIT:  Medications  ketorolac (TORADOL) injection 60 mg (has no administration in time range)     NEW OUTPATIENT MEDICATIONS STARTED DURING THIS VISIT:  New Prescriptions   CLINDAMYCIN (CLEOCIN) 300 MG CAPSULE    Take 1 capsule (300 mg total) by mouth 4 (four) times daily. X 7 days   OXYCODONE-ACETAMINOPHEN (PERCOCET) 5-325 MG TABLET    Take 2 tablets by mouth every 8 (eight) hours as needed for severe pain.    Note:  This note was prepared with assistance of Dragon voice recognition software. Occasional wrong-word or sound-a-like substitutions may have occurred due to the inherent limitations of voice recognition software.   Marily Memos, MD 05/05/18 [phone]

## 2018-05-05 NOTE — Discharge Instructions (Addendum)
Please try to rely on ibuprofen and Tylenol as much as possible without using a prescription pain medication.  If you do use prescription pain medication make sure you do not go over 3000 mg of Tylenol in a day as the prescription pain medication has Tylenol in it as well.   Use warm salt water rinses as you have been doing multiple times a day along with the antibiotics which should help your condition. [place] this is a dental complication and you need dental follow-up to ensure improvement or further management if needed, so please call them and make an appointment in the next 3 to 5 days.

## 2019-07-23 LAB — CYTOLOGY - PAP: Pap: NEGATIVE

## 2019-11-11 ENCOUNTER — Other Ambulatory Visit: Payer: Self-pay

## 2019-11-11 ENCOUNTER — Encounter (HOSPITAL_BASED_OUTPATIENT_CLINIC_OR_DEPARTMENT_OTHER): Payer: Self-pay

## 2019-11-11 ENCOUNTER — Emergency Department (HOSPITAL_BASED_OUTPATIENT_CLINIC_OR_DEPARTMENT_OTHER)
Admission: EM | Admit: 2019-11-11 | Discharge: 2019-11-12 | Disposition: A | Discharge status: Home / Self Care | Payer: Self-pay | Attending: Emergency Medicine | Admitting: Emergency Medicine

## 2019-11-11 DIAGNOSIS — F17210 Nicotine dependence, cigarettes, uncomplicated: Secondary | ICD-10-CM | POA: Insufficient documentation

## 2019-11-11 DIAGNOSIS — R0989 Other specified symptoms and signs involving the circulatory and respiratory systems: Secondary | ICD-10-CM | POA: Insufficient documentation

## 2019-11-11 DIAGNOSIS — J45909 Unspecified asthma, uncomplicated: Secondary | ICD-10-CM | POA: Insufficient documentation

## 2019-11-11 NOTE — ED Triage Notes (Signed)
Pt c/o left posterior leg pain from thigh to ankle-pt states pain started after falling down steps 2 weeks ago-NAD-limping gait

## 2019-11-11 NOTE — ED Provider Notes (Signed)
[facility] Provider Note: Lowella Dell, MD, FACEP  CSN: [id number] MRN: [id number] ARRIVAL: 11/11/19 at 2030 ROOM: [facility]02/[facility]02   CHIEF COMPLAINT  Leg Pain   HISTORY OF PRESENT ILLNESS  11/11/19 11:45 PM Emily Davila is a 34 y.o. female who fell down several stairs about 2 weeks ago.  She had some abrasions to her knees but no other significant pain at the time.  Over the past 2 to 3 days she has developed pain in her left leg from her thigh down to her popliteal region.  The pain is in the posterior leg and feels like muscle spasms.  She is also having some tingling in her left great toe.  She rates her pain as an 8 out of 10, worse with movement or ambulation.  There is no significant edema of that leg.   Past Medical History:  Diagnosis Date  . Asthma   . BV (bacterial vaginosis)   . E. coli UTI   . H/O seasonal allergies   . Headache(784.0)   . PID (acute pelvic inflammatory disease)     Past Surgical History:  Procedure Laterality Date  . CHOLECYSTECTOMY  04/29/2011   Procedure: LAPAROSCOPIC CHOLECYSTECTOMY WITH INTRAOPERATIVE CHOLANGIOGRAM;  Surgeon: Wilmon Arms. Corliss Skains, MD;  Location: [facility] OR;  Service: General;  Laterality: N/A;  latex allergy, inpatient 5127  . FOOT SURGERY      No family history on file.  Social History   Tobacco Use  . Smoking status: Current Every Day Smoker    Types: Cigarettes  . Smokeless tobacco: Never Used  Substance Use Topics  . Alcohol use: No  . Drug use: No    Prior to Admission medications   Medication Sig Start Date End Date Taking? Authorizing Provider  albuterol (PROVENTIL HFA;VENTOLIN HFA) 108 (90 BASE) MCG/ACT inhaler Inhale 2 puffs into the lungs every 4 (four) hours as needed for wheezing or shortness of breath. wheezing 02/28/12 11/12/19  Domenick Gong, MD    Allergies Latex and Keflex [cephalexin]   REVIEW OF SYSTEMS  Negative except as noted here or in the History of Present Illness.   PHYSICAL  EXAMINATION  Initial Vital Signs Blood pressure (!) 123/98, pulse 91, temperature 98.9 F (37.2 C), temperature source Oral, resp. rate 20, weight 111.1 kg, SpO2 100 %.  Examination General: Well-developed, well-nourished female in no acute distress; appearance consistent with age of record HENT: normocephalic; atraumatic Eyes: Normal appearance Neck: supple Heart: regular rate and rhythm Lungs: clear to auscultation bilaterally Abdomen: soft; nondistended; nontender; bowel sounds present Extremities: No deformity; full range of motion; pulses normal; tenderness of left posterior thigh and popliteal fossa; no edema Neurologic: Awake, alert and oriented; motor function intact in all extremities and symmetric; no facial droop Skin: Warm and dry Psychiatric: Normal mood and affect   RESULTS  Summary of this visit's results, reviewed and interpreted by myself:   EKG Interpretation  Date/Time:    Ventricular Rate:    PR Interval:    QRS Duration:   QT Interval:    QTC Calculation:   R Axis:     Text Interpretation:        Laboratory Studies: Results for orders placed or performed during the hospital encounter of 11/11/19 (from the past 24 hour(s))  D-dimer, quantitative (not at [facility])     Status: Abnormal   Collection Time: 11/12/19 12:02 AM  Result Value Ref Range   D-Dimer, Quant 0.58 (H) 0.00 - 0.50 ug/mL-FEU   Imaging Studies: No  results found.  ED COURSE and MDM  Nursing notes, initial and subsequent vitals signs, including pulse oximetry, reviewed and interpreted by myself.  Vitals:   11/11/19 2043 11/12/19 0009  BP: (!) 123/98 (!) 142/98  Pulse: 91 77  Resp: 20 18  Temp: 98.9 F (37.2 C)   TempSrc: Oral   SpO2: 100% 100%  Weight: 111.1 kg    Medications  enoxaparin (LOVENOX) injection 110 mg (has no administration in time range)    12:32 AM D-dimer is elevated.  We will have her return later this morning for a Doppler ultrasound.  PROCEDURES    Procedures   ED DIAGNOSES     ICD-10-CM   1. Suspected DVT (deep vein thrombosis)  R09.89        Shanon Rosser, MD 11/12/19 3382

## 2019-11-12 ENCOUNTER — Ambulatory Visit (HOSPITAL_BASED_OUTPATIENT_CLINIC_OR_DEPARTMENT_OTHER)
Admission: RE | Admit: 2019-11-12 | Discharge: 2019-11-12 | Disposition: A | Discharge status: Home / Self Care | Payer: Self-pay | Source: Ambulatory Visit | Attending: Emergency Medicine | Admitting: Emergency Medicine

## 2019-11-12 DIAGNOSIS — W19XXXA Unspecified fall, initial encounter: Secondary | ICD-10-CM | POA: Insufficient documentation

## 2019-11-12 DIAGNOSIS — M79605 Pain in left leg: Secondary | ICD-10-CM | POA: Insufficient documentation

## 2019-11-12 LAB — D-DIMER, QUANTITATIVE: D-Dimer, Quant: 0.58 ug/mL-FEU — ABNORMAL HIGH (ref 0.00–0.50)

## 2019-11-12 MED ORDER — ENOXAPARIN SODIUM 120 MG/0.8ML ~~LOC~~ SOLN
1.0000 mg/kg | Freq: Once | SUBCUTANEOUS | Status: AC
  Administered 2019-11-12: 110 mg via SUBCUTANEOUS
  Filled 2019-11-12: qty 0.8

## 2020-05-06 LAB — PREGNANCY, URINE

## 2020-05-10 ENCOUNTER — Encounter: Payer: Medicaid Other | Admitting: Family Medicine

## 2020-05-11 ENCOUNTER — Telehealth: Payer: Self-pay | Admitting: Family Medicine

## 2020-05-11 NOTE — Telephone Encounter (Signed)
Patient called to say she had spoken to her case-worker about her Medicaid. She stated it has now been changed to [employer]. I scheduled her an appointment, and informed her if it was not changed in the system we would not be able to see her.

## 2020-05-30 ENCOUNTER — Encounter: Payer: Self-pay | Admitting: *Deleted

## 2020-05-30 ENCOUNTER — Telehealth (INDEPENDENT_AMBULATORY_CARE_PROVIDER_SITE_OTHER): Payer: Medicaid Other | Admitting: *Deleted

## 2020-05-30 ENCOUNTER — Other Ambulatory Visit: Payer: Self-pay

## 2020-05-30 DIAGNOSIS — O0990 Supervision of high risk pregnancy, unspecified, unspecified trimester: Secondary | ICD-10-CM | POA: Insufficient documentation

## 2020-05-30 DIAGNOSIS — O09529 Supervision of elderly multigravida, unspecified trimester: Secondary | ICD-10-CM | POA: Insufficient documentation

## 2020-05-30 DIAGNOSIS — O99210 Obesity complicating pregnancy, unspecified trimester: Secondary | ICD-10-CM

## 2020-05-30 DIAGNOSIS — Z3401 Encounter for supervision of normal first pregnancy, first trimester: Secondary | ICD-10-CM

## 2020-05-30 DIAGNOSIS — Z3A00 Weeks of gestation of pregnancy not specified: Secondary | ICD-10-CM

## 2020-05-30 NOTE — Progress Notes (Signed)
New OB Intake  I connected with  Emily Davila on 05/30/20 at 2:30 by MyChart and verified that I am speaking with the correct person using two identifiers. Nurse is located at [facility] and pt is located at work.  I discussed the limitations, risks, security and privacy concerns of performing an evaluation and management service by telephone and the availability of in person appointments. I also discussed with the patient that there may be a patient responsible charge related to this service. The patient expressed understanding and agreed to proceed.  I explained I am completing New OB Intake today. We discussed her EDD of 12/29/2020 that is based on LMP of 03/24/2020. She had her pregnancy confirmed at [facility] department and has not yet brought in her proof of pregnancy. She will fax it over or send via MyChart.  Pt is G2/P1001. I reviewed her allergies, medications, Medical/Surgical/OB history, and appropriate screenings. I informed her of [facility] services. Based on history, this is a/an uncomplicated pregnancy.  Concerns addressed today  MyChart/Babyscripts MyChart access verified. I explained pt will have some visits in office and some virtually. Babyscripts instructions given.   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  Explained after first prenatal appt pt will check weekly and document in Babyscripts.  Anatomy US Explained first scheduled [place] will be around 19 weeks. Anatomy US scheduled for 08/01/2020 at 0215. Pt notified to arrive at 0200.  Labs Discussed Avelina Laine genetic screening with patient. Would like both Panorama and Horizon drawn at new OB visit. Routine prenatal labs needed.   WIC Patient states she has WIC  COVID Vaccine Patient states she had COVID Vaccine. I asked her to bring her vaccination card so we can update her records.   New Patient Meeting Informed patient about monthly Zoom meetings and  placed information in her AVS.   First visit review I reviewed new OB appt with pt. She states she prefers a female provider and I offered to have registar change; she declined- did not want to delay care. I explained she will have a pelvic exam, ob bloodwork with genetic screening, . Patient had pap at her previous provider. Nurse will abstract from Care Everywhere.  Explained pt will be seen by Dr. Crissie Reese at first visit; encounter routed to appropriate provider.  Emily Odonnell,RN 05/30/2020  2:34 PM

## 2020-05-30 NOTE — Patient Instructions (Signed)
Meet the Provider Zoom Sessions      [facility] is now offering FREE monthly 1-hour virtual Zoom sessions for new, current, and prospective patients.        During these sessions, you can:   Learn about our practice, model of care, services   Get answers to questions about pregnancy and birth during COVID   Pick your provider's brain about anything else!    Sessions will be hosted by [facility] Nurse Practitioners, Physician Assistants, Physicians and Midwives          No registration required      2021 Dates:      All at 6pm     October 21st     November 18th   December 16th     January 20th  February 17th    To join one of these meetings, a few minutes before it is set to start:     Copy/paste the link into your web browser:  [email or web address]    OR  Scan the QR code below (open up your camera and point towards QR code; click on tab that pops up on your phone ("zoom")      At [facility], we work as an integrated team, providing care to address both physical and emotional health. Your medical provider may refer you to see our Behavioral Health Clinician (BHC) on the same day you see your medical provider, as availability permits.  Our BHC is available to all patients, visits generally last between 20-30 minutes, but can be longer or shorter, depending on patient need. The BHC offers help with stress management, coping with symptoms of depression and anxiety, major life changes , sleep issues, changing risky behavior, grief and loss, life stress, working on personal life goals, and  behavioral health issues, as these all affect your overall health and wellness.  The BHC is NOT available for the following: FMLA paperwork, court-ordered evaluations, specialty assessments (custody or disability), letters to  employers, or obtaining certification for an emotional support animal. The BHC does not provide long-term therapy. You have the right to refuse integrated behavioral health services, or to reschedule to see the BHC at a later date.  Exception: If you are having thoughts of suicide, we require that you either see the BHC for further assessment, or contract for safety with your medical provider. Confidentiality exception: If it is suspected that a child or disabled adult is being abused or neglected, we are required by law to report that to either Child Protective Services or Adult Protective Services.  If you have a diagnosis of Bipolar affective disorder, Schizophrenia, or recurrent Major depressive disorder, we will recommend that you establish care with a psychiatrist, as these are lifelong, chronic conditions, and we want your overall emotional health and medications to be more closely monitored. If you anticipate needing extended maternity leave due to mental illness, it it recommended you inform your medical provider, so we can put in a referral to a  psychiatrist as soon as possible. The BHC is unable to recommend an extended maternity leave for mental health issues. Your medical provider or BHC may refer you to a therapist for ongoing, traditional therapy, or to a psychiatrist, for medication management, if it would benefit your overall health. Depending on your insurance, you may have a copay to see the BHC. If you are uninsured, it is recommended that you apply for financial assistance. (  Forms may be requested at the front desk for in-person visits, via MyChart, or request a form during a virtual visit).  If you see the [facility] more than 6 times, you will have to complete a comprehensive clinical assessment interview with the [facility] to resume integrated services.  For virtual visits with the [facility], you must be physically in the state of [place] at the time of the visit. For example, if you live in  [place], you will have to do an in-person visit with the [facility]. If you are going out of the state or country for any reason, the [facility] may see you virtually when you return to [place], but not while you are physically outside of [place].

## 2020-06-09 ENCOUNTER — Telehealth: Payer: Self-pay | Admitting: *Deleted

## 2020-06-09 NOTE — Telephone Encounter (Signed)
Call received from [facility] representative who stated that pt had triggered an elevated BP of 110/90 on 12/14 @ 7:25 pm. PT had no sx of pre-eclampsia @ that time. I called pt and left message stating that I am following up on a call we received from [facility]. I asked pt to check her BP again if she had not already done so and to sit calmly for 10 minutes prior to the reading. She may call back if she has questions.

## 2020-06-15 ENCOUNTER — Encounter: Payer: Self-pay | Admitting: Family Medicine

## 2020-06-15 ENCOUNTER — Other Ambulatory Visit: Payer: Self-pay

## 2020-06-15 ENCOUNTER — Ambulatory Visit (INDEPENDENT_AMBULATORY_CARE_PROVIDER_SITE_OTHER): Payer: Medicaid Other | Admitting: Family Medicine

## 2020-06-15 ENCOUNTER — Other Ambulatory Visit (HOSPITAL_COMMUNITY)
Admission: RE | Admit: 2020-06-15 | Discharge: 2020-06-15 | Disposition: A | Discharge status: Home / Self Care | Payer: Medicaid Other | Source: Ambulatory Visit | Attending: Family Medicine | Admitting: Family Medicine

## 2020-06-15 VITALS — BP 135/87 | HR 94 | Wt 248.8 lb

## 2020-06-15 DIAGNOSIS — O09521 Supervision of elderly multigravida, first trimester: Secondary | ICD-10-CM

## 2020-06-15 DIAGNOSIS — O0990 Supervision of high risk pregnancy, unspecified, unspecified trimester: Secondary | ICD-10-CM | POA: Diagnosis present

## 2020-06-15 DIAGNOSIS — J4520 Mild intermittent asthma, uncomplicated: Secondary | ICD-10-CM

## 2020-06-15 DIAGNOSIS — O99210 Obesity complicating pregnancy, unspecified trimester: Secondary | ICD-10-CM

## 2020-06-15 LAB — POCT URINALYSIS DIP (DEVICE)
Bilirubin Urine: NEGATIVE
Bilirubin Urine: NEGATIVE
Glucose, UA: NEGATIVE mg/dL
Glucose, UA: NEGATIVE mg/dL
Ketones, ur: NEGATIVE mg/dL
Ketones, ur: NEGATIVE mg/dL
Leukocytes,Ua: NEGATIVE
Nitrite: POSITIVE — AB
Nitrite: POSITIVE — AB
Protein, ur: NEGATIVE mg/dL
Protein, ur: NEGATIVE mg/dL
Specific Gravity, Urine: 1.025 (ref 1.005–1.030)
Specific Gravity, Urine: 1.025 (ref 1.005–1.030)
Urobilinogen, UA: 0.2 mg/dL (ref 0.0–1.0)
Urobilinogen, UA: 0.2 mg/dL (ref 0.0–1.0)
pH: 6.5 (ref 5.0–8.0)
pH: 7 (ref 5.0–8.0)

## 2020-06-15 MED ORDER — ALBUTEROL SULFATE HFA 108 (90 BASE) MCG/ACT IN AERS: 1.0000 | INHALATION_SPRAY | RESPIRATORY_TRACT | 1 refills | Status: AC | PRN

## 2020-06-15 NOTE — Patient Instructions (Signed)
 First Trimester of Pregnancy The first trimester of pregnancy is from week 1 until the end of week 13 (months 1 through 3). A week after a sperm fertilizes an egg, the egg will implant on the wall of the uterus. This embryo will begin to develop into a baby. Genes from you and your partner will form the baby. The female genes will determine whether the baby will be a boy or a girl. At 6-8 weeks, the eyes and face will be formed, and the heartbeat can be seen on ultrasound. At the end of 12 weeks, all the baby's organs will be formed. Now that you are pregnant, you will want to do everything you can to have a healthy baby. Two of the most important things are to get good prenatal care and to follow your health care provider's instructions. Prenatal care is all the medical care you receive before the baby's birth. This care will help prevent, find, and treat any problems during the pregnancy and childbirth. Body changes during your first trimester Your body goes through many changes during pregnancy. The changes vary from woman to woman.  You may gain or lose a couple of pounds at first.  You may feel sick to your stomach (nauseous) and you may throw up (vomit). If the vomiting is uncontrollable, call your health care provider.  You may tire easily.  You may develop headaches that can be relieved by medicines. All medicines should be approved by your health care provider.  You may urinate more often. Painful urination may mean you have a bladder infection.  You may develop heartburn as a result of your pregnancy.  You may develop constipation because certain hormones are causing the muscles that push stool through your intestines to slow down.  You may develop hemorrhoids or swollen veins (varicose veins).  Your breasts may begin to grow larger and become tender. Your nipples may stick out more, and the tissue that surrounds them (areola) may become darker.  Your gums may bleed and may be  sensitive to brushing and flossing.  Dark spots or blotches (chloasma, mask of pregnancy) may develop on your face. This will likely fade after the baby is born.  Your menstrual periods will stop.  You may have a loss of appetite.  You may develop cravings for certain kinds of food.  You may have changes in your emotions from day to day, such as being excited to be pregnant or being concerned that something may go wrong with the pregnancy and baby.  You may have more vivid and strange dreams.  You may have changes in your hair. These can include thickening of your hair, rapid growth, and changes in texture. Some women also have hair loss during or after pregnancy, or hair that feels dry or thin. Your hair will most likely return to normal after your baby is born. What to expect at prenatal visits During a routine prenatal visit:  You will be weighed to make sure you and the baby are growing normally.  Your blood pressure will be taken.  Your abdomen will be measured to track your baby's growth.  The fetal heartbeat will be listened to between weeks 10 and 14 of your pregnancy.  Test results from any previous visits will be discussed. Your health care provider may ask you:  How you are feeling.  If you are feeling the baby move.  If you have had any abnormal symptoms, such as leaking fluid, bleeding, severe headaches, or   abdominal cramping.  If you are using any tobacco products, including cigarettes, chewing tobacco, and electronic cigarettes.  If you have any questions. Other tests that may be performed during your first trimester include:  Blood tests to find your blood type and to check for the presence of any previous infections. The tests will also be used to check for low iron levels (anemia) and protein on red blood cells (Rh antibodies). Depending on your risk factors, or if you previously had diabetes during pregnancy, you may have tests to check for high blood sugar  that affects pregnant women (gestational diabetes).  Urine tests to check for infections, diabetes, or protein in the urine.  An ultrasound to confirm the proper growth and development of the baby.  Fetal screens for spinal cord problems (spina bifida) and Down syndrome.  HIV (human immunodeficiency virus) testing. Routine prenatal testing includes screening for HIV, unless you choose not to have this test.  You may need other tests to make sure you and the baby are doing well. Follow these instructions at home: Medicines  Follow your health care provider's instructions regarding medicine use. Specific medicines may be either safe or unsafe to take during pregnancy.  Take a prenatal vitamin that contains at least 600 micrograms (mcg) of folic acid.  If you develop constipation, try taking a stool softener if your health care provider approves. Eating and drinking   Eat a balanced diet that includes fresh fruits and vegetables, whole grains, good sources of protein such as meat, eggs, or tofu, and low-fat dairy. Your health care provider will help you determine the amount of weight gain that is right for you.  Avoid raw meat and uncooked cheese. These carry germs that can cause birth defects in the baby.  Eating four or five small meals rather than three large meals a day may help relieve nausea and vomiting. If you start to feel nauseous, eating a few soda crackers can be helpful. Drinking liquids between meals, instead of during meals, also seems to help ease nausea and vomiting.  Limit foods that are high in fat and processed sugars, such as fried and sweet foods.  To prevent constipation: ? Eat foods that are high in fiber, such as fresh fruits and vegetables, whole grains, and beans. ? Drink enough fluid to keep your urine clear or pale yellow. Activity  Exercise only as directed by your health care provider. Most women can continue their usual exercise routine during  pregnancy. Try to exercise for 30 minutes at least 5 days a week. Exercising will help you: ? Control your weight. ? Stay in shape. ? Be prepared for labor and delivery.  Experiencing pain or cramping in the lower abdomen or lower back is a good sign that you should stop exercising. Check with your health care provider before continuing with normal exercises.  Try to avoid standing for long periods of time. Move your legs often if you must stand in one place for a long time.  Avoid heavy lifting.  Wear low-heeled shoes and practice good posture.  You may continue to have sex unless your health care provider tells you not to. Relieving pain and discomfort  Wear a good support bra to relieve breast tenderness.  Take warm sitz baths to soothe any pain or discomfort caused by hemorrhoids. Use hemorrhoid cream if your health care provider approves.  Rest with your legs elevated if you have leg cramps or low back pain.  If you develop varicose veins   in your legs, wear support hose. Elevate your feet for 15 minutes, 3-4 times a day. Limit salt in your diet. Prenatal care  Schedule your prenatal visits by the twelfth week of pregnancy. They are usually scheduled monthly at first, then more often in the last 2 months before delivery.  Write down your questions. Take them to your prenatal visits.  Keep all your prenatal visits as told by your health care provider. This is important. Safety  Wear your seat belt at all times when driving.  Make a list of emergency phone numbers, including numbers for family, friends, the hospital, and police and fire departments. General instructions  Ask your health care provider for a referral to a local prenatal education class. Begin classes no later than the beginning of month 6 of your pregnancy.  Ask for help if you have counseling or nutritional needs during pregnancy. Your health care provider can offer advice or refer you to specialists for help  with various needs.  Do not use hot tubs, steam rooms, or saunas.  Do not douche or use tampons or scented sanitary pads.  Do not cross your legs for long periods of time.  Avoid cat litter boxes and soil used by cats. These carry germs that can cause birth defects in the baby and possibly loss of the fetus by miscarriage or stillbirth.  Avoid all smoking, herbs, alcohol, and medicines not prescribed by your health care provider. Chemicals in these products affect the formation and growth of the baby.  Do not use any products that contain nicotine or tobacco, such as cigarettes and e-cigarettes. If you need help quitting, ask your health care provider. You may receive counseling support and other resources to help you quit.  Schedule a dentist appointment. At home, brush your teeth with a soft toothbrush and be gentle when you floss. Contact a health care provider if:  You have dizziness.  You have mild pelvic cramps, pelvic pressure, or nagging pain in the abdominal area.  You have persistent nausea, vomiting, or diarrhea.  You have a bad smelling vaginal discharge.  You have pain when you urinate.  You notice increased swelling in your face, hands, legs, or ankles.  You are exposed to fifth disease or chickenpox.  You are exposed to [place] measles (rubella) and have never had it. Get help right away if:  You have a fever.  You are leaking fluid from your vagina.  You have spotting or bleeding from your vagina.  You have severe abdominal cramping or pain.  You have rapid weight gain or loss.  You vomit blood or material that looks like coffee grounds.  You develop a severe headache.  You have shortness of breath.  You have any kind of trauma, such as from a fall or a car accident. Summary  The first trimester of pregnancy is from week 1 until the end of week 13 (months 1 through 3).  Your body goes through many changes during pregnancy. The changes vary from  woman to woman.  You will have routine prenatal visits. During those visits, your health care provider will examine you, discuss any test results you may have, and talk with you about how you are feeling. This information is not intended to replace advice given to you by your health care provider. Make sure you discuss any questions you have with your health care provider. Document Revised: 05/24/2017 Document Reviewed: 05/23/2016 Elsevier Patient Education  2020 [employer].   Contraception Choices Contraception,  also called birth control, refers to methods or devices that prevent pregnancy. Hormonal methods Contraceptive implant  A contraceptive implant is a thin, plastic tube that contains a hormone. It is inserted into the upper part of the arm. It can remain in place for up to 3 years. Progestin-only injections Progestin-only injections are injections of progestin, a synthetic form of the hormone progesterone. They are given every 3 months by a health care provider. Birth control pills  Birth control pills are pills that contain hormones that prevent pregnancy. They must be taken once a day, preferably at the same time each day. Birth control patch  The birth control patch contains hormones that prevent pregnancy. It is placed on the skin and must be changed once a week for three weeks and removed on the fourth week. A prescription is needed to use this method of contraception. Vaginal ring  A vaginal ring contains hormones that prevent pregnancy. It is placed in the vagina for three weeks and removed on the fourth week. After that, the process is repeated with a new ring. A prescription is needed to use this method of contraception. Emergency contraceptive Emergency contraceptives prevent pregnancy after unprotected sex. They come in pill form and can be taken up to 5 days after sex. They work best the sooner they are taken after having sex. Most emergency contraceptives are  available without a prescription. This method should not be used as your only form of birth control. Barrier methods Female condom  A female condom is a thin sheath that is worn over the penis during sex. Condoms keep sperm from going inside a woman's body. They can be used with a spermicide to increase their effectiveness. They should be disposed after a single use. Female condom  A female condom is a soft, loose-fitting sheath that is put into the vagina before sex. The condom keeps sperm from going inside a woman's body. They should be disposed after a single use. Diaphragm  A diaphragm is a soft, dome-shaped barrier. It is inserted into the vagina before sex, along with a spermicide. The diaphragm blocks sperm from entering the uterus, and the spermicide kills sperm. A diaphragm should be left in the vagina for 6-8 hours after sex and removed within 24 hours. A diaphragm is prescribed and fitted by a health care provider. A diaphragm should be replaced every 1-2 years, after giving birth, after gaining more than 15 lb (6.8 kg), and after pelvic surgery. Cervical cap  A cervical cap is a round, soft latex or plastic cup that fits over the cervix. It is inserted into the vagina before sex, along with spermicide. It blocks sperm from entering the uterus. The cap should be left in place for 6-8 hours after sex and removed within 48 hours. A cervical cap must be prescribed and fitted by a health care provider. It should be replaced every 2 years. Sponge  A sponge is a soft, circular piece of polyurethane foam with spermicide on it. The sponge helps block sperm from entering the uterus, and the spermicide kills sperm. To use it, you make it wet and then insert it into the vagina. It should be inserted before sex, left in for at least 6 hours after sex, and removed and thrown away within 30 hours. Spermicides Spermicides are chemicals that kill or block sperm from entering the cervix and uterus. They can  come as a cream, jelly, suppository, foam, or tablet. A spermicide should be inserted into the vagina with  an applicator at least 10-15 minutes before sex to allow time for it to work. The process must be repeated every time you have sex. Spermicides do not require a prescription. Intrauterine contraception Intrauterine device (IUD) An IUD is a T-shaped device that is put in a woman's uterus. There are two types:  Hormone IUD.This type contains progestin, a synthetic form of the hormone progesterone. This type can stay in place for 3-5 years.  Copper IUD.This type is wrapped in copper wire. It can stay in place for 10 years.  Permanent methods of contraception Female tubal ligation In this method, a woman's fallopian tubes are sealed, tied, or blocked during surgery to prevent eggs from traveling to the uterus. Hysteroscopic sterilization In this method, a small, flexible insert is placed into each fallopian tube. The inserts cause scar tissue to form in the fallopian tubes and block them, so sperm cannot reach an egg. The procedure takes about 3 months to be effective. Another form of birth control must be used during those 3 months. Female sterilization This is a procedure to tie off the tubes that carry sperm (vasectomy). After the procedure, the man can still ejaculate fluid (semen). Natural planning methods Natural family planning In this method, a couple does not have sex on days when the woman could become pregnant. Calendar method This means keeping track of the length of each menstrual cycle, identifying the days when pregnancy can happen, and not having sex on those days. Ovulation method In this method, a couple avoids sex during ovulation. Symptothermal method This method involves not having sex during ovulation. The woman typically checks for ovulation by watching changes in her temperature and in the consistency of cervical mucus. Post-ovulation method In this method, a couple  waits to have sex until after ovulation. Summary  Contraception, also called birth control, means methods or devices that prevent pregnancy.  Hormonal methods of contraception include implants, injections, pills, patches, vaginal rings, and emergency contraceptives.  Barrier methods of contraception can include female condoms, female condoms, diaphragms, cervical caps, sponges, and spermicides.  There are two types of IUDs (intrauterine devices). An IUD can be put in a woman's uterus to prevent pregnancy for 3-5 years.  Permanent sterilization can be done through a procedure for males, females, or both.  Natural family planning methods involve not having sex on days when the woman could become pregnant. This information is not intended to replace advice given to you by your health care provider. Make sure you discuss any questions you have with your health care provider. Document Revised: 06/13/2017 Document Reviewed: 07/14/2016 Elsevier Patient Education  2020 [employer].   Breastfeeding  Choosing to breastfeed is one of the best decisions you can make for yourself and your baby. A change in hormones during pregnancy causes your breasts to make breast milk in your milk-producing glands. Hormones prevent breast milk from being released before your baby is born. They also prompt milk flow after birth. Once breastfeeding has begun, thoughts of your baby, as well as his or her sucking or crying, can stimulate the release of milk from your milk-producing glands. Benefits of breastfeeding Research shows that breastfeeding offers many health benefits for infants and mothers. It also offers a cost-free and convenient way to feed your baby. For your baby  Your first milk (colostrum) helps your baby's digestive system to function better.  Special cells in your milk (antibodies) help your baby to fight off infections.  Breastfed babies are less likely to develop  asthma, allergies, obesity, or  type 2 diabetes. They are also at lower risk for sudden infant death syndrome (SIDS).  Nutrients in breast milk are better able to meet your baby's needs compared to infant formula.  Breast milk improves your baby's brain development. For you  Breastfeeding helps to create a very special bond between you and your baby.  Breastfeeding is convenient. Breast milk costs nothing and is always available at the correct temperature.  Breastfeeding helps to burn calories. It helps you to lose the weight that you gained during pregnancy.  Breastfeeding makes your uterus return faster to its size before pregnancy. It also slows bleeding (lochia) after you give birth.  Breastfeeding helps to lower your risk of developing type 2 diabetes, osteoporosis, rheumatoid arthritis, cardiovascular disease, and breast, ovarian, uterine, and endometrial cancer later in life. Breastfeeding basics Starting breastfeeding  Find a comfortable place to sit or lie down, with your neck and back well-supported.  Place a pillow or a rolled-up blanket under your baby to bring him or her to the level of your breast (if you are seated). Nursing pillows are specially designed to help support your arms and your baby while you breastfeed.  Make sure that your baby's tummy (abdomen) is facing your abdomen.  Gently massage your breast. With your fingertips, massage from the outer edges of your breast inward toward the nipple. This encourages milk flow. If your milk flows slowly, you may need to continue this action during the feeding.  Support your breast with 4 fingers underneath and your thumb above your nipple (make the letter "C" with your hand). Make sure your fingers are well away from your nipple and your baby's mouth.  Stroke your baby's lips gently with your finger or nipple.  When your baby's mouth is open wide enough, quickly bring your baby to your breast, placing your entire nipple and as much of the areola as  possible into your baby's mouth. The areola is the colored area around your nipple. ? More areola should be visible above your baby's upper lip than below the lower lip. ? Your baby's lips should be opened and extended outward (flanged) to ensure an adequate, comfortable latch. ? Your baby's tongue should be between his or her lower gum and your breast.  Make sure that your baby's mouth is correctly positioned around your nipple (latched). Your baby's lips should create a seal on your breast and be turned out (everted).  It is common for your baby to suck about 2-3 minutes in order to start the flow of breast milk. Latching Teaching your baby how to latch onto your breast properly is very important. An improper latch can cause nipple pain, decreased milk supply, and poor weight gain in your baby. Also, if your baby is not latched onto your nipple properly, he or she may swallow some air during feeding. This can make your baby fussy. Burping your baby when you switch breasts during the feeding can help to get rid of the air. However, teaching your baby to latch on properly is still the best way to prevent fussiness from swallowing air while breastfeeding. Signs that your baby has successfully latched onto your nipple  Silent tugging or silent sucking, without causing you pain. Infant's lips should be extended outward (flanged).  Swallowing heard between every 3-4 sucks once your milk has started to flow (after your let-down milk reflex occurs).  Muscle movement above and in front of his or her ears while sucking. Signs  that your baby has not successfully latched onto your nipple  Sucking sounds or smacking sounds from your baby while breastfeeding.  Nipple pain. If you think your baby has not latched on correctly, slip your finger into the corner of your baby's mouth to break the suction and place it between your baby's gums. Attempt to start breastfeeding again. Signs of successful  breastfeeding Signs from your baby  Your baby will gradually decrease the number of sucks or will completely stop sucking.  Your baby will fall asleep.  Your baby's body will relax.  Your baby will retain a small amount of milk in his or her mouth.  Your baby will let go of your breast by himself or herself. Signs from you  Breasts that have increased in firmness, weight, and size 1-3 hours after feeding.  Breasts that are softer immediately after breastfeeding.  Increased milk volume, as well as a change in milk consistency and color by the fifth day of breastfeeding.  Nipples that are not sore, cracked, or bleeding. Signs that your baby is getting enough milk  Wetting at least 1-2 diapers during the first 24 hours after birth.  Wetting at least 5-6 diapers every 24 hours for the first week after birth. The urine should be clear or pale yellow by the age of 5 days.  Wetting 6-8 diapers every 24 hours as your baby continues to grow and develop.  At least 3 stools in a 24-hour period by the age of 5 days. The stool should be soft and yellow.  At least 3 stools in a 24-hour period by the age of 7 days. The stool should be seedy and yellow.  No loss of weight greater than 10% of birth weight during the first 3 days of life.  Average weight gain of 4-7 oz (113-198 g) per week after the age of 4 days.  Consistent daily weight gain by the age of 5 days, without weight loss after the age of 2 weeks. After a feeding, your baby may spit up a small amount of milk. This is normal. Breastfeeding frequency and duration Frequent feeding will help you make more milk and can prevent sore nipples and extremely full breasts (breast engorgement). Breastfeed when you feel the need to reduce the fullness of your breasts or when your baby shows signs of hunger. This is called "breastfeeding on demand." Signs that your baby is hungry include:  Increased alertness, activity, or  restlessness.  Movement of the head from side to side.  Opening of the mouth when the corner of the mouth or cheek is stroked (rooting).  Increased sucking sounds, smacking lips, cooing, sighing, or squeaking.  Hand-to-mouth movements and sucking on fingers or hands.  Fussing or crying. Avoid introducing a pacifier to your baby in the first 4-6 weeks after your baby is born. After this time, you may choose to use a pacifier. Research has shown that pacifier use during the first year of a baby's life decreases the risk of sudden infant death syndrome (SIDS). Allow your baby to feed on each breast as long as he or she wants. When your baby unlatches or falls asleep while feeding from the first breast, offer the second breast. Because newborns are often sleepy in the first few weeks of life, you may need to awaken your baby to get him or her to feed. Breastfeeding times will vary from baby to baby. However, the following rules can serve as a guide to help you make sure  that your baby is properly fed:  Newborns (babies 30 weeks of age or younger) may breastfeed every 1-3 hours.  Newborns should not go without breastfeeding for longer than 3 hours during the day or 5 hours during the night.  You should breastfeed your baby a minimum of 8 times in a 24-hour period. Breast milk pumping     Pumping and storing breast milk allows you to make sure that your baby is exclusively fed your breast milk, even at times when you are unable to breastfeed. This is especially important if you go back to work while you are still breastfeeding, or if you are not able to be present during feedings. Your lactation consultant can help you find a method of pumping that works best for you and give you guidelines about how long it is safe to store breast milk. Caring for your breasts while you breastfeed Nipples can become dry, cracked, and sore while breastfeeding. The following recommendations can help keep your  breasts moisturized and healthy:  Avoid using soap on your nipples.  Wear a supportive bra designed especially for nursing. Avoid wearing underwire-style bras or extremely tight bras (sports bras).  Air-dry your nipples for 3-4 minutes after each feeding.  Use only cotton bra pads to absorb leaked breast milk. Leaking of breast milk between feedings is normal.  Use lanolin on your nipples after breastfeeding. Lanolin helps to maintain your skin's normal moisture barrier. Pure lanolin is not harmful (not toxic) to your baby. You may also hand express a few drops of breast milk and gently massage that milk into your nipples and allow the milk to air-dry. In the first few weeks after giving birth, some women experience breast engorgement. Engorgement can make your breasts feel heavy, warm, and tender to the touch. Engorgement peaks within 3-5 days after you give birth. The following recommendations can help to ease engorgement:  Completely empty your breasts while breastfeeding or pumping. You may want to start by applying warm, moist heat (in the shower or with warm, water-soaked hand towels) just before feeding or pumping. This increases circulation and helps the milk flow. If your baby does not completely empty your breasts while breastfeeding, pump any extra milk after he or she is finished.  Apply ice packs to your breasts immediately after breastfeeding or pumping, unless this is too uncomfortable for you. To do this: ? Put ice in a plastic bag. ? Place a towel between your skin and the bag. ? Leave the ice on for 20 minutes, 2-3 times a day.  Make sure that your baby is latched on and positioned properly while breastfeeding. If engorgement persists after 48 hours of following these recommendations, contact your health care provider or a Advertising copywriter. Overall health care recommendations while breastfeeding  Eat 3 healthy meals and 3 snacks every day. Well-nourished mothers who are  breastfeeding need an additional 450-500 calories a day. You can meet this requirement by increasing the amount of a balanced diet that you eat.  Drink enough water to keep your urine pale yellow or clear.  Rest often, relax, and continue to take your prenatal vitamins to prevent fatigue, stress, and low vitamin and mineral levels in your body (nutrient deficiencies).  Do not use any products that contain nicotine or tobacco, such as cigarettes and e-cigarettes. Your baby may be harmed by chemicals from cigarettes that pass into breast milk and exposure to secondhand smoke. If you need help quitting, ask your health care provider.  Avoid alcohol.  Do not use illegal drugs or marijuana.  Talk with your health care provider before taking any medicines. These include over-the-counter and prescription medicines as well as vitamins and herbal supplements. Some medicines that may be harmful to your baby can pass through breast milk.  It is possible to become pregnant while breastfeeding. If birth control is desired, ask your health care provider about options that will be safe while breastfeeding your baby. Where to find more information: [employer]: www.llli.org Contact a health care provider if:  You feel like you want to stop breastfeeding or have become frustrated with breastfeeding.  Your nipples are cracked or bleeding.  Your breasts are red, tender, or warm.  You have: ? Painful breasts or nipples. ? A swollen area on either breast. ? A fever or chills. ? Nausea or vomiting. ? Drainage other than breast milk from your nipples.  Your breasts do not become full before feedings by the fifth day after you give birth.  You feel sad and depressed.  Your baby is: ? Too sleepy to eat well. ? Having trouble sleeping. ? More than 1 week old and wetting fewer than 6 diapers in a 24-hour period. ? Not gaining weight by 5 days of age.  Your baby has fewer than 3  stools in a 24-hour period.  Your baby's skin or the white parts of his or her eyes become yellow. Get help right away if:  Your baby is overly tired (lethargic) and does not want to wake up and feed.  Your baby develops an unexplained fever. Summary  Breastfeeding offers many health benefits for infant and mothers.  Try to breastfeed your infant when he or she shows early signs of hunger.  Gently tickle or stroke your baby's lips with your finger or nipple to allow the baby to open his or her mouth. Bring the baby to your breast. Make sure that much of the areola is in your baby's mouth. Offer one side and burp the baby before you offer the other side.  Talk with your health care provider or lactation consultant if you have questions or you face problems as you breastfeed. This information is not intended to replace advice given to you by your health care provider. Make sure you discuss any questions you have with your health care provider. Document Revised: 09/05/2017 Document Reviewed: 07/13/2016 Elsevier Patient Education  2020 Elsevier Inc.  

## 2020-06-15 NOTE — Progress Notes (Signed)
   Subjective:  Emily Davila is a 34 y.o. G2P1001 at [redacted]w[redacted]d being seen today for ongoing prenatal care.  She is currently monitored for the following issues for this high-risk pregnancy and has Cholecystitis; Supervision of high risk pregnancy, antepartum; AMA (advanced maternal age) multigravida 35+; Obesity during pregnancy; Mild intermittent asthma without complication; and Anxiety on their problem list.  Patient reports no complaints.  Contractions: Not present. Vag. Bleeding: None.  Movement: Absent. Denies leaking of fluid.   The following portions of the patient's history were reviewed and updated as appropriate: allergies, current medications, past family history, past medical history, past social history, past surgical history and problem list. Problem list updated.  Objective:   Vitals:   06/15/20 1546  BP: 135/87  Pulse: 94  Weight: 248 lb 12.8 oz (112.9 kg)    Fetal Status: Fetal Heart Rate (bpm): 150   Movement: Absent     General:  Alert, oriented and cooperative. Patient is in no acute distress.  Skin: Skin is warm and dry. No rash noted.   Cardiovascular: Normal heart rate noted  Respiratory: Normal respiratory effort, no problems with respiration noted  Abdomen: Soft, gravid, appropriate for gestational age. Pain/Pressure: Present     Pelvic: Vag. Bleeding: None     Cervical exam deferred        Extremities: Normal range of motion.  Edema: None  Mental Status: Normal mood and affect. Normal behavior. Normal judgment and thought content.   Urinalysis:      Assessment and Plan:  Pregnancy: G2P1001 at [redacted]w[redacted]d  1. Supervision of high risk pregnancy, antepartum Reviewed structure of Faculty practice with patient, that they may not always see the same provider, and that physicians, fellows, CNM, NP, PA, residents, and medical students may all be involved in their care. Reviewed last delivery, 9+ pound baby with no issues at delivery in [place] Routine prenatal  labs, accepts genetic testing Anatomy US scheduled for 08/01/2020 Reports elevated BP's at home though normal here, will obtain baseline PreE labs Many questions answered regarding structure of practice, paternity testing, genetic testing Has previously discussed with her gynecologist getting hysterectomy due to hx of menorrhagia and dysmenorrhea for contraception - POCT urinalysis dip (device) - CBC/D/Plt+RPR+Rh+ABO+Rub Ab... - GC/Chlamydia probe amp ([place])not at [facility] - Genetic Screening - Culture, OB Urine - Hemoglobin A1c - Comprehensive metabolic panel - Protein / creatinine ratio, urine  2. Obesity during pregnancy   3. Multigravida of advanced maternal age in first trimester   4. Mild intermittent asthma without complication Reports rare rescue inhaler use Refill sent for albuterol  Preterm labor symptoms and general obstetric precautions including but not limited to vaginal bleeding, contractions, leaking of fluid and fetal movement were reviewed in detail with the patient. Please refer to After Visit Summary for other counseling recommendations.  Return in 4 weeks (on 07/13/2020).   Venora Maples, MD

## 2020-06-16 ENCOUNTER — Encounter: Payer: Self-pay | Admitting: *Deleted

## 2020-06-16 LAB — CBC/D/PLT+RPR+RH+ABO+RUB AB...
Antibody Screen: NEGATIVE
Basophils Absolute: 0 10*3/uL (ref 0.0–0.2)
Basos: 0 %
EOS (ABSOLUTE): 0.1 10*3/uL (ref 0.0–0.4)
Eos: 1 %
HCV Ab: <0.1 s/co ratio (ref 0.0–0.9)
HIV Screen 4th Generation wRfx: NONREACTIVE
Hematocrit: 34.3 % (ref 34.0–46.6)
Hemoglobin: 11.6 g/dL (ref 11.1–15.9)
Hepatitis B Surface Ag: NEGATIVE
Immature Grans (Abs): 0 10*3/uL (ref 0.0–0.1)
Immature Granulocytes: 0 %
Lymphocytes Absolute: 4.5 10*3/uL — ABNORMAL HIGH (ref 0.7–3.1)
Lymphs: 35 %
MCH: 29.4 pg (ref 26.6–33.0)
MCHC: 33.8 g/dL (ref 31.5–35.7)
MCV: 87 fL (ref 79–97)
Monocytes Absolute: 1.1 10*3/uL — ABNORMAL HIGH (ref 0.1–0.9)
Monocytes: 8 %
Neutrophils Absolute: 7.2 10*3/uL — ABNORMAL HIGH (ref 1.4–7.0)
Neutrophils: 56 %
Platelets: 406 10*3/uL (ref 150–450)
RBC: 3.94 x10E6/uL (ref 3.77–5.28)
RDW: 15.2 % (ref 11.7–15.4)
RPR Ser Ql: NONREACTIVE
Rh Factor: POSITIVE
Rubella Antibodies, IGG: 2.42 index (ref >0.99)
WBC: 12.9 10*3/uL — ABNORMAL HIGH (ref 3.4–10.8)

## 2020-06-16 LAB — GC/CHLAMYDIA PROBE AMP (~~LOC~~) NOT AT ARMC
Chlamydia: NEGATIVE
Comment: NEGATIVE
Comment: NORMAL
Neisseria Gonorrhea: NEGATIVE

## 2020-06-16 LAB — HEMOGLOBIN A1C
Est. average glucose Bld gHb Est-mCnc: 111 mg/dL
Hgb A1c MFr Bld: 5.5 % (ref 4.8–5.6)

## 2020-06-16 LAB — PROTEIN / CREATININE RATIO, URINE
Creatinine, Urine: 67.3 mg/dL
Protein, Ur: 20 mg/dL
Protein/Creat Ratio: 297 mg/g creat — ABNORMAL HIGH (ref 0–200)

## 2020-06-16 LAB — COMPREHENSIVE METABOLIC PANEL
ALT: 13 IU/L (ref 0–32)
AST: 13 IU/L (ref 0–40)
Albumin/Globulin Ratio: 1.4 (ref 1.2–2.2)
Albumin: 4 g/dL (ref 3.8–4.8)
Alkaline Phosphatase: 78 IU/L (ref 44–121)
BUN/Creatinine Ratio: 20 (ref 9–23)
BUN: 9 mg/dL (ref 6–20)
Bilirubin Total: 0.2 mg/dL (ref 0.0–1.2)
CO2: 17 mmol/L — ABNORMAL LOW (ref 20–29)
Calcium: 9.2 mg/dL (ref 8.7–10.2)
Chloride: 102 mmol/L (ref 96–106)
Creatinine, Ser: 0.44 mg/dL — ABNORMAL LOW (ref 0.57–1.00)
GFR calc Af Amer: 150 mL/min/{1.73_m2} (ref >59)
GFR calc non Af Amer: 130 mL/min/{1.73_m2} (ref >59)
Globulin, Total: 2.8 g/dL (ref 1.5–4.5)
Glucose: 78 mg/dL (ref 65–99)
Potassium: 4.1 mmol/L (ref 3.5–5.2)
Sodium: 135 mmol/L (ref 134–144)
Total Protein: 6.8 g/dL (ref 6.0–8.5)

## 2020-06-16 LAB — HCV INTERPRETATION

## 2020-06-18 LAB — CULTURE, OB URINE

## 2020-06-18 LAB — URINE CULTURE, OB REFLEX

## 2020-06-20 ENCOUNTER — Other Ambulatory Visit: Payer: Self-pay | Admitting: Family Medicine

## 2020-06-20 MED ORDER — NITROFURANTOIN MONOHYD MACRO 100 MG PO CAPS
100.0000 mg | ORAL_CAPSULE | Freq: Two times a day (BID) | ORAL | 0 refills | Status: AC
Start: 2020-06-20 — End: 2020-06-25

## 2020-06-20 NOTE — Progress Notes (Signed)
Asymptomatic bacteriuria treatment

## 2020-06-25 NOTE — L&D Delivery Note (Addendum)
OB/GYN Faculty Practice Delivery Note  Emily Davila is a 35 y.o. G2P1001 s/p vaginal delivery at [redacted]w[redacted]d. She was admitted for IOL secondary to gHTN.  ROM: 17h 79m with clear fluid GBS Status: negative Maximum Maternal Temperature: 100.25F  Labor Progress: On admission, cytotec was administered. Pt was transitioned to pitocin and had AROM for clear fluid at 2030 on 12/11/20. Pt developed a fever to 100.25F at 2341 on 12/11/20; she received Unasyn prior to delivery. Pt continued to progress to complete cervical dilation at 1331 on 12/12/20. She then had an uncomplicated delivery s/p a brief second stage.  Delivery Date/Time: 12/12/20 at 1338 Delivery: Called to room and patient was complete and pushing. Head delivered LOA. Nuchal cord present x1; reduced s/p delivery of head. Shoulder and body delivered in usual fashion. Infant with spontaneous cry, placed on mother's abdomen, dried and stimulated. Cord clamped x 2 after 1-minute delay, and cut by FOB under my direct supervision. Arterial cord gas (pH 7.31) and cord blood drawn. Placenta delivered spontaneously with gentle cord traction. Fundus firm with massage and Pitocin. Labia, perineum, vagina, and cervix were inspected, without evidence of lacerations.   Placenta: 3-vessel cord, intact, sent to L&D Complications: Triple I (s/p Unasyn prior to delivery) Lacerations: none EBL: 50 ml Analgesia: epidural  Infant: viable female  APGARs 8 & 46  3521g  Emily Shields, MD OB/GYN Fellow, Faculty Practice

## 2020-06-29 ENCOUNTER — Encounter (HOSPITAL_BASED_OUTPATIENT_CLINIC_OR_DEPARTMENT_OTHER): Payer: Self-pay

## 2020-06-29 ENCOUNTER — Other Ambulatory Visit: Payer: Self-pay

## 2020-06-29 ENCOUNTER — Emergency Department (HOSPITAL_BASED_OUTPATIENT_CLINIC_OR_DEPARTMENT_OTHER)
Admission: EM | Admit: 2020-06-29 | Discharge: 2020-06-29 | Disposition: A | Discharge status: Home / Self Care | Payer: Medicaid Other | Attending: Emergency Medicine | Admitting: Emergency Medicine

## 2020-06-29 DIAGNOSIS — B349 Viral infection, unspecified: Secondary | ICD-10-CM | POA: Diagnosis not present

## 2020-06-29 DIAGNOSIS — R5383 Other fatigue: Secondary | ICD-10-CM

## 2020-06-29 DIAGNOSIS — Z91040 Latex allergy status: Secondary | ICD-10-CM | POA: Diagnosis not present

## 2020-06-29 DIAGNOSIS — Z20822 Contact with and (suspected) exposure to covid-19: Secondary | ICD-10-CM | POA: Diagnosis not present

## 2020-06-29 DIAGNOSIS — Z87891 Personal history of nicotine dependence: Secondary | ICD-10-CM | POA: Diagnosis not present

## 2020-06-29 DIAGNOSIS — R112 Nausea with vomiting, unspecified: Secondary | ICD-10-CM | POA: Diagnosis present

## 2020-06-29 DIAGNOSIS — J45909 Unspecified asthma, uncomplicated: Secondary | ICD-10-CM | POA: Diagnosis not present

## 2020-06-29 LAB — URINALYSIS, ROUTINE W REFLEX MICROSCOPIC
Bilirubin Urine: NEGATIVE
Glucose, UA: NEGATIVE mg/dL
Ketones, ur: >=80 mg/dL — AB
Nitrite: NEGATIVE
Protein, ur: NEGATIVE mg/dL
Specific Gravity, Urine: 1.02 (ref 1.005–1.030)
pH: 6.5 (ref 5.0–8.0)

## 2020-06-29 LAB — COMPREHENSIVE METABOLIC PANEL
ALT: 33 U/L (ref 0–44)
AST: 24 U/L (ref 15–41)
Albumin: 3.4 g/dL — ABNORMAL LOW (ref 3.5–5.0)
Alkaline Phosphatase: 68 U/L (ref 38–126)
Anion gap: 11 (ref 5–15)
BUN: 6 mg/dL (ref 6–20)
CO2: 20 mmol/L — ABNORMAL LOW (ref 22–32)
Calcium: 9 mg/dL (ref 8.9–10.3)
Chloride: 102 mmol/L (ref 98–111)
Creatinine, Ser: 0.5 mg/dL (ref 0.44–1.00)
GFR, Estimated: >60 mL/min (ref >60)
Glucose, Bld: 88 mg/dL (ref 70–99)
Potassium: 3.3 mmol/L — ABNORMAL LOW (ref 3.5–5.1)
Sodium: 133 mmol/L — ABNORMAL LOW (ref 135–145)
Total Bilirubin: 0.5 mg/dL (ref 0.3–1.2)
Total Protein: 7.3 g/dL (ref 6.5–8.1)

## 2020-06-29 LAB — CBC WITH DIFFERENTIAL/PLATELET
Abs Immature Granulocytes: 0.04 10*3/uL (ref 0.00–0.07)
Basophils Absolute: 0 10*3/uL (ref 0.0–0.1)
Basophils Relative: 0 %
Eosinophils Absolute: 0 10*3/uL (ref 0.0–0.5)
Eosinophils Relative: 0 %
HCT: 36.9 % (ref 36.0–46.0)
Hemoglobin: 12.4 g/dL (ref 12.0–15.0)
Immature Granulocytes: 0 %
Lymphocytes Relative: 15 %
Lymphs Abs: 1.5 10*3/uL (ref 0.7–4.0)
MCH: 29.7 pg (ref 26.0–34.0)
MCHC: 33.6 g/dL (ref 30.0–36.0)
MCV: 88.3 fL (ref 80.0–100.0)
Monocytes Absolute: 0.8 10*3/uL (ref 0.1–1.0)
Monocytes Relative: 8 %
Neutro Abs: 7.7 10*3/uL (ref 1.7–7.7)
Neutrophils Relative %: 77 %
Platelets: 378 10*3/uL (ref 150–400)
RBC: 4.18 MIL/uL (ref 3.87–5.11)
RDW: 15.3 % (ref 11.5–15.5)
WBC: 10.2 10*3/uL (ref 4.0–10.5)
nRBC: 0 % (ref 0.0–0.2)

## 2020-06-29 LAB — URINALYSIS, MICROSCOPIC (REFLEX)

## 2020-06-29 LAB — LIPASE, BLOOD: Lipase: 22 U/L (ref 11–51)

## 2020-06-29 LAB — HCG, QUANTITATIVE, PREGNANCY: hCG, Beta Chain, Quant, S: 37434 m[IU]/mL — ABNORMAL HIGH (ref <5)

## 2020-06-29 NOTE — ED Triage Notes (Addendum)
Pt c/o body aches, "swelling to the glands in my groin", vomited x 1-states she is [redacted] weeks pregnant-NAD-steady gait-states she went to PCP office was told they could not see her today but did a covid test that she reports as neg

## 2020-06-29 NOTE — Discharge Instructions (Addendum)
You have been seen here for URI like symptoms.  I recommend taking Tylenol for fever control and ibuprofen for pain control please follow dosing on the back of bottle.  I recommend staying hydrated and if you do not an appetite, I recommend soups as this will provide you with fluids and calories.  Your Covid test is pending I recommend self quarantine until you get your results back on MyChart.    If you are Covid positive you must self quarantine for 5 days starting on symptom onset.  If at the end of those 5 days you are feeling better you may return back to work/school, if you continue have symptoms you must quarantine for additional 5 days. I would like you to contact "post Covid care" as they will provide you with information how to manage your Covid symptoms.  If you are Covid negative and continue to have symptoms please follow-up with your OB/GYN and/or PCP  Come back to the emergency department if you develop chest pain, shortness of breath, severe abdominal pain, uncontrolled nausea, vomiting, diarrhea.

## 2020-06-29 NOTE — ED Notes (Signed)
Pt here with complaints of aches and pains.  Groin pain and upper abdominal tenderness and soreness with palpatation

## 2020-06-29 NOTE — ED Provider Notes (Signed)
[facility] EMERGENCY DEPARTMENT Provider Note   CSN: [id number] Arrival date & time: 06/29/20  1308     History Chief Complaint  Patient presents with  . Multiple c/o    14 weeks preg    Emily Davila is a 35 y.o. female.  HPI   Patient with significant medical history of anxiety, asthma, PID [redacted] weeks pregnant G2 P1-0-0-1 presents to the emergency department with chief complaint of fatigue and URI-like symptoms.  Patient endorses last night she started to feel fatigued and this morning she developed a slight headache, nausea, vomiting, and felt like she had enlarged lymph nodes in her groin.  She denies nasal congestion, sore throat, cough, chest pain, abdominal pain, urinary symptoms, vaginal discharge, vaginal bleeding, pelvic trauma.  She states she is vaccine against COVID-19, denies recent sick contacts, is not immunocompromise.  She was recently seen at her OB/GYN on 12/22 and noted to have a high risk pregnancy due to advanced maternal age and obesity.  She was treated for a asymptomatic UTI and finished her antibiotics without difficulty.  She has no other complaints at this time.    Past Medical History:  Diagnosis Date  . Anxiety 09/02/2015  . Asthma   . BV (bacterial vaginosis)   . E. coli UTI   . H/O seasonal allergies   . Headache(784.0)   . PID (acute pelvic inflammatory disease)     Patient Active Problem List   Diagnosis Date Noted  . Supervision of high risk pregnancy, antepartum 05/30/2020  . AMA (advanced maternal age) multigravida 35+ 05/30/2020  . Obesity during pregnancy 05/30/2020  . Anxiety 09/02/2015  . Mild intermittent asthma without complication 06/02/2015  . Cholecystitis 05/15/2011    Past Surgical History:  Procedure Laterality Date  . CHOLECYSTECTOMY  04/29/2011   Procedure: LAPAROSCOPIC CHOLECYSTECTOMY WITH INTRAOPERATIVE CHOLANGIOGRAM;  Surgeon: Wilmon Arms. Corliss Skains, MD;  Location: [facility] OR;  Service: General;  Laterality: N/A;  latex  allergy, inpatient 5127  . FOOT SURGERY       OB History    Gravida  2   Para  1   Term  1   Preterm  0   AB  0   Living  1     SAB  0   IAB  0   Ectopic  0   Multiple  0   Live Births  1           Family History  Adopted: Yes  Problem Relation Age of Onset  . Graves' disease Father     Social History   Tobacco Use  . Smoking status: Former Smoker    Types: Cigarettes    Quit date: 08/2018    Years since quitting: 1.8  . Smokeless tobacco: Never Used  Vaping Use  . Vaping Use: Never used  Substance Use Topics  . Alcohol use: No  . Drug use: Not Currently    Types: Marijuana    Comment: in the past    Home Medications Prior to Admission medications   Medication Sig Start Date End Date Taking? Authorizing Provider  albuterol (VENTOLIN HFA) 108 (90 Base) MCG/ACT inhaler Inhale 1 puff into the lungs every 4 (four) hours as needed for wheezing or shortness of breath. 06/15/20   Venora Maples, MD  Prenatal Vit-Fe Fumarate-FA (PRENATAL VITAMIN PO) Take 1 tablet by mouth daily.    [provider]    Allergies    Latex and Keflex [cephalexin]  Review of Systems  Review of Systems  Constitutional: Positive for fatigue. Negative for chills and fever.  HENT: Negative for congestion.   Respiratory: Negative for shortness of breath.   Cardiovascular: Negative for chest pain.  Gastrointestinal: Positive for nausea and vomiting. Negative for abdominal pain and diarrhea.  Genitourinary: Negative for decreased urine volume, dysuria, enuresis, flank pain, vaginal bleeding and vaginal discharge.  Musculoskeletal: Negative for myalgias.  Skin: Negative for rash.  Neurological: Positive for headaches.  Hematological: Does not bruise/bleed easily.    Physical Exam Updated Vital Signs BP 116/74 (BP Location: Right Arm)   Pulse 96   Temp 99.1 F (37.3 C) (Oral)   Resp 18   Ht 5\' 6"  (1.676 m)   Wt 112 kg   LMP 03/24/2020   SpO2 100%    BMI 39.87 kg/m   Physical Exam Vitals and nursing note reviewed.  Constitutional:      General: She is not in acute distress.    Appearance: She is not ill-appearing.  HENT:     Head: Normocephalic and atraumatic.     Right Ear: Tympanic membrane, ear canal and external ear normal.     Left Ear: Tympanic membrane, ear canal and external ear normal.     Nose: No congestion.     Mouth/Throat:     Mouth: Mucous membranes are moist.     Pharynx: Oropharynx is clear. No oropharyngeal exudate or posterior oropharyngeal erythema.  Eyes:     Conjunctiva/sclera: Conjunctivae normal.  Cardiovascular:     Rate and Rhythm: Normal rate and regular rhythm.     Pulses: Normal pulses.     Heart sounds: No murmur heard. No friction rub. No gallop.   Pulmonary:     Effort: No respiratory distress.     Breath sounds: No wheezing, rhonchi or rales.  Abdominal:     General: There is distension.     Palpations: Abdomen is soft.     Tenderness: There is abdominal tenderness. There is no right CVA tenderness or left CVA tenderness.     Comments: Patient's abdomen is distended, fundus was noted between between the pelvis and umbilicus.  Fetal activity noted.  She had slight tenderness palpation in her umbilicus region, no rebound tenderness, no peritoneal sign noted.  Negative CVA tenderness  Musculoskeletal:     Right lower leg: No edema.     Left lower leg: No edema.     Comments: Patient is moving all 4 extremities out difficulty.  Skin:    General: Skin is warm and dry.  Neurological:     Mental Status: She is alert.  Psychiatric:        Mood and Affect: Mood normal.     ED Results / Procedures / Treatments   Labs (all labs ordered are listed, but only abnormal results are displayed) Labs Reviewed  COMPREHENSIVE METABOLIC PANEL - Abnormal; Notable for the following components:      Result Value   Sodium 133 (*)    Potassium 3.3 (*)    CO2 20 (*)    Albumin 3.4 (*)    All other  components within normal limits  HCG, QUANTITATIVE, PREGNANCY - Abnormal; Notable for the following components:   hCG, Beta Chain, Quant, S 37,434 (*)    All other components within normal limits  URINALYSIS, ROUTINE W REFLEX MICROSCOPIC - Abnormal; Notable for the following components:   APPearance CLOUDY (*)    Hgb urine dipstick MODERATE (*)    Ketones, ur >=80 (*)  Leukocytes,Ua MODERATE (*)    All other components within normal limits  URINALYSIS, MICROSCOPIC (REFLEX) - Abnormal; Notable for the following components:   Bacteria, UA MANY (*)    All other components within normal limits  SARS CORONAVIRUS 2 (TAT 6-24 HRS)  CBC WITH DIFFERENTIAL/PLATELET  LIPASE, BLOOD    EKG None  Radiology No results found.  Procedures Procedures (including critical care time)  Medications Ordered in ED Medications - No data to display  ED Course  I have reviewed the triage vital signs and the nursing notes.  Pertinent labs & imaging results that were available during my care of the patient were reviewed by me and considered in my medical decision making (see chart for details).    MDM Rules/Calculators/A&P                          Patient presents with URI-like symptoms.  She is alert, does not appear acute distress, vital signs reassuring.  Will obtain basic lab work-up.   CBC negative for leukocytosis or signs anemia.  CMP shows slight hyponatremia of 133, slight hypokalemia of 3.3, slight decrease in CO2 of 20, no AKI, no anion gap present.  Lipase is 22.  hCG quantitative is 37,000.  UA shows ketones, moderate leukocytes, moderate red blood cells, moderate white blood cells, many bacteria, many squamous cells.  Low suspicion for systemic infection as patient is nontoxic-appearing, vital signs reassuring, no obvious source infection on my exam.  Low suspicion for pneumonia as lung sounds are clear bilaterally, will defer x-ray at this time.  Low suspicion for intra-abdominal  abnormality requiring immediate intervention as patient tolerating p.o., no peritoneal signs on my exam.  Low suspicion for threatened pregnancy as patient denies pelvic pain, vaginal discharge or vaginal bleeding.  Bedside ultrasound showed good fetal movement and heart rate.  I have low suspicion for UTI or pyelonephritis as patient denies urinary symptoms, no CVA tenderness during my exam.  Patient has a noted contaminated UA, will defer repeat as she denies urinary symptoms, patient was recently treated with antibiotics 1 week ago for possible UTI.  Suspect patient suffering from possible viral URI, will recommend following up with PCP and/or OB/GYN for further evaluation.  Vital signs have remained stable, no indication for hospital admission.  Patient discussed with attending and they agreed with assessment and plan.  Patient given at home care as well strict return precautions.  Patient verbalized that they understood agreed to said plan.  Final Clinical Impression(s) / ED Diagnoses Final diagnoses:  Fatigue, unspecified type  Viral illness    Rx / DC Orders ED Discharge Orders    None       Carroll Sage, PA-C 06/29/20 1728    Tilden Fossa, MD 06/29/20 1806

## 2020-06-30 LAB — SARS CORONAVIRUS 2 (TAT 6-24 HRS): SARS Coronavirus 2: NEGATIVE

## 2020-07-13 ENCOUNTER — Encounter: Payer: Self-pay | Admitting: Family Medicine

## 2020-07-13 ENCOUNTER — Ambulatory Visit (INDEPENDENT_AMBULATORY_CARE_PROVIDER_SITE_OTHER): Payer: Medicaid Other | Admitting: Student

## 2020-07-13 ENCOUNTER — Other Ambulatory Visit: Payer: Self-pay

## 2020-07-13 VITALS — BP 135/86 | HR 103 | Wt 250.1 lb

## 2020-07-13 DIAGNOSIS — O0990 Supervision of high risk pregnancy, unspecified, unspecified trimester: Secondary | ICD-10-CM

## 2020-07-13 MED ORDER — BLOOD PRESSURE KIT DEVI: 1.0000 | Freq: Once | 0 refills | Status: AC

## 2020-07-13 NOTE — Progress Notes (Signed)
   PRENATAL VISIT NOTE  Subjective:  Emily Davila is a 35 y.o. G2P1001 at [redacted]w[redacted]d being seen today for ongoing prenatal care.  She is currently monitored for the following issues for this low-risk pregnancy and has Cholecystitis; Supervision of high risk pregnancy, antepartum; AMA (advanced maternal age) multigravida 35+; Obesity during pregnancy; Mild intermittent asthma without complication; and Anxiety on their problem list.  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 She has been getting knots.  She has been trying Gas-x. She reports intake of gas-producing vegetables.  .  Contractions: Not present. Vag. Bleeding: None.  Movement: Absent. Denies leaking of fluid.   The following portions of the patient's history were reviewed and updated as appropriate: allergies, current medications, past family history, past medical history, past social history, past surgical history and problem list.   Objective:   Vitals:   07/13/20 1454  BP: 135/86  Pulse: (!) 103  Weight: 250 lb 1.6 oz (113.4 kg)    Fetal Status:     Movement: Absent     General:  Alert, oriented and cooperative. Patient is in no acute distress.  Skin: Skin is warm and dry. No rash noted.   Cardiovascular: Normal heart rate noted  Respiratory: Normal respiratory effort, no problems with respiration noted  Abdomen: Soft, gravid, appropriate for gestational age.  Pain/Pressure: Present     Pelvic: Cervical exam deferred        Extremities: Normal range of motion.  Edema: None  Mental Status: Normal mood and affect. Normal behavior. Normal judgment and thought content.   Assessment and Plan:  Pregnancy: G2P1001 at [redacted]w[redacted]d 1. Supervision of high risk pregnancy, antepartum -will go to [place] to pick up BP cuff  - Blood Pressure Monitoring (BLOOD PRESSURE KIT) DEVI; 1 Device by Does not apply route once for 1 dose.  Dispense: 1 each; Refill: 0  Preterm labor symptoms and general obstetric  precautions including but not limited to vaginal bleeding, contractions, leaking of fluid and fetal movement were reviewed in detail with the patient. Please refer to After Visit Summary for other counseling recommendations.   Return in about 4 weeks (around 08/10/2020), or LROB on My Chart with [place].  Future Appointments  Date Time Provider [place]  08/01/2020  2:00 PM [facility] NURSE [facility]  08/01/2020  2:15 PM [facility] [place], CNM

## 2020-07-15 LAB — AFP, SERUM, OPEN SPINA BIFIDA
AFP MoM: 1.38
AFP Value: 35.9 ng/mL
Gest. Age on Collection Date: 15.6 weeks
Maternal Age At EDD: 37 yr
OSBR Risk 1 IN: 7620
Test Results:: NEGATIVE
Weight: 250 [lb_av]

## 2020-07-18 ENCOUNTER — Telehealth: Payer: Self-pay

## 2020-07-18 NOTE — Telephone Encounter (Signed)
Received notification from [employer] that the pt reported an elevated BP of 158/98 with no sx's.  Called pt and pt informed me that she has retaken her values and they were normal however she does not remember what they are.  I asked pt if she could please when she gets home to retake BP after resting for about 15 minutes.  Pt states that she will attempt to send value via Mychart or call the office an leave it in a message on nurse call back line.  Pt verbalized understanding.   Addison Naegeli, RN  07/18/20

## 2020-08-01 ENCOUNTER — Other Ambulatory Visit: Payer: Self-pay

## 2020-08-01 ENCOUNTER — Ambulatory Visit: Payer: Medicaid Other | Attending: Family Medicine

## 2020-08-01 ENCOUNTER — Encounter: Payer: Self-pay | Admitting: *Deleted

## 2020-08-01 ENCOUNTER — Ambulatory Visit: Payer: Medicaid Other | Admitting: *Deleted

## 2020-08-01 DIAGNOSIS — O0990 Supervision of high risk pregnancy, unspecified, unspecified trimester: Secondary | ICD-10-CM | POA: Insufficient documentation

## 2020-08-01 DIAGNOSIS — O09529 Supervision of elderly multigravida, unspecified trimester: Secondary | ICD-10-CM | POA: Diagnosis not present

## 2020-08-01 DIAGNOSIS — O99210 Obesity complicating pregnancy, unspecified trimester: Secondary | ICD-10-CM | POA: Diagnosis not present

## 2020-08-02 ENCOUNTER — Other Ambulatory Visit: Payer: Self-pay | Admitting: *Deleted

## 2020-08-02 DIAGNOSIS — Z362 Encounter for other antenatal screening follow-up: Secondary | ICD-10-CM

## 2020-08-10 ENCOUNTER — Telehealth (INDEPENDENT_AMBULATORY_CARE_PROVIDER_SITE_OTHER): Payer: Medicaid Other | Admitting: Nurse Practitioner

## 2020-08-10 DIAGNOSIS — O99512 Diseases of the respiratory system complicating pregnancy, second trimester: Secondary | ICD-10-CM

## 2020-08-10 DIAGNOSIS — Z3A19 19 weeks gestation of pregnancy: Secondary | ICD-10-CM

## 2020-08-10 DIAGNOSIS — O09522 Supervision of elderly multigravida, second trimester: Secondary | ICD-10-CM

## 2020-08-10 DIAGNOSIS — O99612 Diseases of the digestive system complicating pregnancy, second trimester: Secondary | ICD-10-CM

## 2020-08-10 DIAGNOSIS — O09529 Supervision of elderly multigravida, unspecified trimester: Secondary | ICD-10-CM

## 2020-08-10 DIAGNOSIS — K439 Ventral hernia without obstruction or gangrene: Secondary | ICD-10-CM

## 2020-08-10 DIAGNOSIS — O0990 Supervision of high risk pregnancy, unspecified, unspecified trimester: Secondary | ICD-10-CM

## 2020-08-10 DIAGNOSIS — E669 Obesity, unspecified: Secondary | ICD-10-CM

## 2020-08-10 DIAGNOSIS — O99212 Obesity complicating pregnancy, second trimester: Secondary | ICD-10-CM

## 2020-08-10 DIAGNOSIS — K831 Obstruction of bile duct: Secondary | ICD-10-CM

## 2020-08-10 DIAGNOSIS — O99210 Obesity complicating pregnancy, unspecified trimester: Secondary | ICD-10-CM

## 2020-08-10 DIAGNOSIS — J4520 Mild intermittent asthma, uncomplicated: Secondary | ICD-10-CM

## 2020-08-10 DIAGNOSIS — O99891 Other specified diseases and conditions complicating pregnancy: Secondary | ICD-10-CM

## 2020-08-10 NOTE — Progress Notes (Signed)
OBSTETRICS PRENATAL VIRTUAL VISIT ENCOUNTER NOTE  Provider location: Center for [facility] [facility]   I connected with [employer] on 08/10/20 at 10:15 AM EST by MyChart Video Encounter at home and verified that I am speaking with the correct person using two identifiers.   I discussed the limitations, risks, security and privacy concerns of performing an evaluation and management service virtually and the availability of in person appointments. I also discussed with the patient that there may be a patient responsible charge related to this service. The patient expressed understanding and agreed to proceed. Subjective:  Emily Davila is a 35 y.o. G2P1001 at [redacted]w[redacted]d being seen today for ongoing prenatal care.  She is currently monitored for the following issues for this high-risk pregnancy and has Cholecystitis; Supervision of high risk pregnancy, antepartum; AMA (advanced maternal age) multigravida 35+; Obesity during pregnancy; Mild intermittent asthma without complication; and Anxiety on their problem list.  Patient reports upper abdominal pain.  Contractions: Not present. Vag. Bleeding: None.  Movement: Present. Denies any leaking of fluid.   The following portions of the patient's history were reviewed and updated as appropriate: allergies, current medications, past family history, past medical history, past social history, past surgical history and problem list.   Objective:  There were no vitals filed for this visit.  Fetal Status:     Movement: Present     General:  Alert, oriented and cooperative. Patient is in no acute distress.  Respiratory: Normal respiratory effort, no problems with respiration noted  Mental Status: Normal mood and affect. Normal behavior. Normal judgment and thought content.  Rest of physical exam deferred due to type of encounter  Imaging: [place] MFM OB DETAIL +14 WK  Result Date:  08/01/2020 ----------------------------------------------------------------------  OBSTETRICS REPORT                       (Signed Final 08/01/2020 03:41 pm) ---------------------------------------------------------------------- Patient Info  ID #:       [id number]                          D.O.B.:  May 09, 1986 (34 yrs)  Name:       Emily Davila                 Visit Date: 08/01/2020 02:02 pm ---------------------------------------------------------------------- Performed By  Attending:        Noralee Space MD        Ref. Address:     [address]                                                             [place], [place]  [address]  Performed By:     Jenel Lucks     [facility]  Referred By:      Venora Maples MD ---------------------------------------------------------------------- Orders  #  Description                           Code        Ordered By  1  [place] MFM OB DETAIL +14 WK               76811.01    MATTHEW ECKSTAT ----------------------------------------------------------------------  #  Order #                     Accession #                Episode #  1  [id number]                   [id number]                 [id number] ---------------------------------------------------------------------- Indications  [redacted] weeks gestation of pregnancy                Z3A.18  Encounter for antenatal screening,             Z36.9  unspecified  Obesity complicating pregnancy, second         O99.212  trimester(BMI:40) ---------------------------------------------------------------------- Fetal Evaluation  Num Of Fetuses:         1  Cardiac  Activity:       Observed  Presentation:           Cephalic  Placenta:               Anterior  P. Cord Insertion:      Visualized, central  Amniotic Fluid  AFI FV:      Within normal limits                              Largest Pocket(cm)                              4.17 ---------------------------------------------------------------------- Biometry  BPD:      45.3  mm     G. Age:  19w 5d         90  %    CI:        72.81   %    70 - 86                                                          FL/HC:      17.7   %    16.1 - 18.3  HC:      168.8  mm     G. Age:  19w 4d         85  %    HC/AC:      1.16        1.09 - 1.39  AC:      145.8  mm     G. Age:  19w 6d         86  %    FL/BPD:     66.0   %  FL:       29.9  mm     G. Age:  19w 2d         68  %    FL/AC:      20.5   %    20 - 24  HUM:      30.5  mm     G. Age:  20w 1d         93  %  NFT:       4.8  mm  LV:        7.4  mm  CM:        7.6  mm  Est. FW:     301  gm    0 lb 11 oz      94  % ---------------------------------------------------------------------- OB History  Gravidity:    2         Term:   1  Living:       1 ---------------------------------------------------------------------- Gestational Age  Clinical EDD:  18w 4d                                        EDD:   12/29/20  U/S Today:     19w 4d                                        EDD:   12/22/20  Best:          18w 4d     Det. By:  Clinical EDD             EDD:   12/29/20 ---------------------------------------------------------------------- Anatomy  Cranium:               Appears normal         Aortic Arch:            Appears normal  Cavum:                 Appears normal         Ductal Arch:  Appears normal  Ventricles:            Appears normal         Diaphragm:              Appears normal  Choroid Plexus:        Appears normal         Stomach:                Appears normal, left                                                                        sided  Cerebellum:            Appears  normal         Abdomen:                Appears normal  Posterior Fossa:       Appears normal         Abdominal Wall:         Appears nml (cord                                                                        insert, abd wall)  Nuchal Fold:           Appears normal         Cord Vessels:           Appears normal (3                                                                        vessel cord)  Face:                  Appears normal         Kidneys:                Appear normal                         (orbits and profile)  Lips:                  Appears normal         Bladder:                Appears normal  Thoracic:              Appears normal         Spine:                  Not well visualized  Heart:                 Not well visualized  Upper Extremities:      Appears normal  RVOT:                  Not well visualized    Lower Extremities:      Appears normal  LVOT:                  Not well visualized ---------------------------------------------------------------------- Cervix Uterus Adnexa  Cervix  Length:           3.18  cm.  Normal appearance by transabdominal scan. ---------------------------------------------------------------------- Impression  G2 P1. Patient is here for fetal anatomy scan.  On cell-free fetal DNA screening, the risks of fetal  aneuploidies are not increased .MSAFP screening showed  low risk for open-neural tube defects .  Obstetric history is significant for a term vaginal delivery.  We performed fetal anatomy scan. No makers of  aneuploidies or fetal structural defects are seen. Fetal  biometry is consistent with her previously-established dates.  Amniotic fluid is normal and good fetal activity is seen.  Patient understands the limitations of ultrasound in detecting  fetal anomalies.  Maternal obesity imposes limitations on the resolution of  images, and failure to detect fetal anomalies is more common  in obese pregnant women. As maternal obesity makes  clinical assessment of  fetal growth difficult, we recommend  serial growth scans until delivery. ---------------------------------------------------------------------- Recommendations  -An appointment was made for her to return in 4 weeks for  completion of fetal anatomy. ----------------------------------------------------------------------                  Noralee Space, MD Electronically Signed Final Report   08/01/2020 03:41 pm ----------------------------------------------------------------------   Assessment and Plan:  Pregnancy: G2P1001 at [redacted]w[redacted]d 1. Supervision of high risk pregnancy, antepartum Is at work at this virtual visit and not able to check BP today.  Has checked BP previously and entered into babyscripts.  Reviewed findings in babyscripts and noted all diastolic readings are 90 or above.  Reviewed with her to rest for a few minutes before taking bp and do not talk while BP is being taken.  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  Advised she needs to be seen in the office and bring her cuff for verification of BP readings.  Reviewed BP too high in pregnancy as 140/90.  Advised to check BP weekly and enter into [employer]ys[employer]s.  Had preeclampsia labs done in December and protein creatinine ration was elevated.  Denies any problems with anxiety and depression - states she has never had anxiety.  2. Antepartum multigravida of advanced maternal age   94. Obesity during pregnancy   4. Mild intermittent asthma without complication Is using inhaler infrequently.  Not daily.  Not even weekly.  5. Hernia of abdominal wall Reports upper abdominal pain in the midline where a 'Ball" forms and is painful.  Feels like her food does not digest due to this.  Thinks it is a hernia.  States this is the 3rd time she has reported this problem and feels it is getting worse.  Denies any heartburn.  While this could be diastasis, she needs an in person visit for further evaluation.  Reviewed indications of  an emergency with a hernia and if these occur, would need to go immediately to ER.  States pain is getting worse from time to time and almost at the point of needing to go to the ER.  Client is currently wearying a support  garment across her entire abdomen.   Preterm labor symptoms and general obstetric precautions including but not limited to vaginal bleeding, contractions, leaking of fluid and fetal movement were reviewed in detail with the patient. I discussed the assessment and treatment plan with the patient. The patient was provided an opportunity to ask questions and all were answered. The patient agreed with the plan and demonstrated an understanding of the instructions. The patient was advised to call back or seek an in-person office evaluation/go to [facility] at [facility] for any urgent or concerning symptoms. Please refer to After Visit Summary for other counseling recommendations.   I provided 15 minutes of face-to-face time during this encounter.  Return in about 1 week (around 08/17/2020) for ROB with MD for evaluation of hernia and BP.  Future Appointments  Date Time Provider Department Center  08/29/2020  3:15 PM [facility] NURSE [facility]  08/29/2020  3:30 PM [facility] US3 [facility]    Currie Paris, NP Center for [employer], [facility]

## 2020-08-10 NOTE — Progress Notes (Signed)
Called pt @ 1013 and left message stating that I will call her in 15 minutes in which if I do not reach you we will have to request that she reschedules her appt.    I connected with  Emily Davila on 08/10/20 at 10:15 AM EST by telephone and verified that I am speaking with the correct person using two identifiers.   I discussed the limitations, risks, security and privacy concerns of performing an evaluation and management service by telephone and the availability of in person appointments. I also discussed with the patient that there may be a patient responsible charge related to this service. The patient expressed understanding and agreed to proceed.  Ralene Bathe, RN 08/10/2020  10:16 AM Addison Naegeli, RN 08/10/20

## 2020-08-11 ENCOUNTER — Telehealth: Payer: Medicaid Other | Admitting: Student

## 2020-08-18 ENCOUNTER — Telehealth: Payer: Self-pay | Admitting: *Deleted

## 2020-08-18 NOTE — Telephone Encounter (Signed)
Message received from Babys[employer]s on 2/22 via e-mail stating that pt had elevated BP of 110/90. Pt was reporting no additional sx @ the time. I called pt to discuss the BP reading and she did not answer. I left a VM message stating that I was following up on her BP. I stated for the pt to call our office if she has questions or concerns.

## 2020-08-19 ENCOUNTER — Encounter: Payer: Self-pay | Admitting: *Deleted

## 2020-08-26 ENCOUNTER — Encounter: Payer: Self-pay | Admitting: *Deleted

## 2020-08-29 ENCOUNTER — Ambulatory Visit: Payer: Medicaid Other | Attending: Obstetrics and Gynecology

## 2020-08-29 ENCOUNTER — Encounter: Payer: Self-pay | Admitting: *Deleted

## 2020-08-29 ENCOUNTER — Other Ambulatory Visit: Payer: Self-pay

## 2020-08-29 ENCOUNTER — Ambulatory Visit: Payer: Medicaid Other | Admitting: *Deleted

## 2020-08-29 DIAGNOSIS — O99210 Obesity complicating pregnancy, unspecified trimester: Secondary | ICD-10-CM

## 2020-08-29 DIAGNOSIS — O0990 Supervision of high risk pregnancy, unspecified, unspecified trimester: Secondary | ICD-10-CM

## 2020-08-29 DIAGNOSIS — O09529 Supervision of elderly multigravida, unspecified trimester: Secondary | ICD-10-CM

## 2020-08-29 DIAGNOSIS — Z362 Encounter for other antenatal screening follow-up: Secondary | ICD-10-CM | POA: Diagnosis present

## 2020-08-30 ENCOUNTER — Other Ambulatory Visit: Payer: Self-pay | Admitting: Obstetrics

## 2020-08-30 DIAGNOSIS — O09522 Supervision of elderly multigravida, second trimester: Secondary | ICD-10-CM

## 2020-08-30 DIAGNOSIS — O99212 Obesity complicating pregnancy, second trimester: Secondary | ICD-10-CM

## 2020-09-08 ENCOUNTER — Ambulatory Visit (INDEPENDENT_AMBULATORY_CARE_PROVIDER_SITE_OTHER): Payer: Medicaid Other | Admitting: Obstetrics and Gynecology

## 2020-09-08 ENCOUNTER — Encounter: Payer: Self-pay | Admitting: Family Medicine

## 2020-09-08 ENCOUNTER — Other Ambulatory Visit: Payer: Self-pay

## 2020-09-08 VITALS — BP 126/80 | HR 93 | Wt 247.3 lb

## 2020-09-08 DIAGNOSIS — J4520 Mild intermittent asthma, uncomplicated: Secondary | ICD-10-CM

## 2020-09-08 DIAGNOSIS — O3663X1 Maternal care for excessive fetal growth, third trimester, fetus 1: Secondary | ICD-10-CM | POA: Insufficient documentation

## 2020-09-08 DIAGNOSIS — O09522 Supervision of elderly multigravida, second trimester: Secondary | ICD-10-CM

## 2020-09-08 DIAGNOSIS — K439 Ventral hernia without obstruction or gangrene: Secondary | ICD-10-CM | POA: Insufficient documentation

## 2020-09-08 DIAGNOSIS — O0990 Supervision of high risk pregnancy, unspecified, unspecified trimester: Secondary | ICD-10-CM

## 2020-09-08 DIAGNOSIS — O99210 Obesity complicating pregnancy, unspecified trimester: Secondary | ICD-10-CM

## 2020-09-08 DIAGNOSIS — Z3A24 24 weeks gestation of pregnancy: Secondary | ICD-10-CM | POA: Insufficient documentation

## 2020-09-08 DIAGNOSIS — O3662X1 Maternal care for excessive fetal growth, second trimester, fetus 1: Secondary | ICD-10-CM

## 2020-09-08 DIAGNOSIS — F419 Anxiety disorder, unspecified: Secondary | ICD-10-CM

## 2020-09-08 NOTE — Progress Notes (Signed)
   PRENATAL VISIT NOTE  Subjective:  Emily Davila is a 35 y.o. G2P1001 at [redacted]w[redacted]d being seen today for ongoing prenatal care.  She is currently monitored for the following issues for this high-risk pregnancy and has Cholecystitis; Supervision of high risk pregnancy, antepartum; AMA (advanced maternal age) multigravida 35+; Obesity during pregnancy; Mild intermittent asthma without complication; Anxiety; [redacted] weeks gestation of pregnancy; and Abdominal wall hernia on their problem list.  Patient doing well with no acute concerns today. She reports mild abdominal pain possibly associated with hernia.  Contractions: Not present. Vag. Bleeding: None.  Movement: Present. Denies leaking of fluid.   Pt concerned about possible abdominal wall hernia which she thinks may be associated with a previous cholecystectomy.  She states she can "feel the bowel go in and out," and she reduces it herself.  The following portions of the patient's history were reviewed and updated as appropriate: allergies, current medications, past family history, past medical history, past social history, past surgical history and problem list. Problem list updated.  Objective:   Vitals:   09/08/20 1046 09/08/20 1054  BP: 140/88 126/80  Pulse: 95 93  Weight: 247 lb 4.8 oz (112.2 kg)     Fetal Status: Fetal Heart Rate (bpm): 128 Fundal Height: 26 cm Movement: Present     General:  Alert, oriented and cooperative. Patient is in no acute distress.  Skin: Skin is warm and dry. No rash noted.   Cardiovascular: Normal heart rate noted  Respiratory: Normal respiratory effort, no problems with respiration noted  Abdomen: Soft, gravid, appropriate for gestational age.  Pain/Pressure: Present     Pelvic: Cervical exam deferred        Extremities: Normal range of motion.  Edema: None  Mental Status:  Normal mood and affect. Normal behavior. Normal judgment and thought content.   Assessment and Plan:  Pregnancy: G2P1001 at  [redacted]w[redacted]d  1. Mild intermittent asthma without complication   2. Multigravida of advanced maternal age in second trimester   3. Anxiety   4. Obesity during pregnancy   5. Supervision of high risk pregnancy, antepartum 2 hour GTT next visit  6. [redacted] weeks gestation of pregnancy   7. Abdominal wall hernia Pt given risks and etiology of incarcerated hernia.  Pt advised to seek ER assistance if she has abdominal pain that doesn't resolve.  Preterm labor symptoms and general obstetric precautions including but not limited to vaginal bleeding, contractions, leaking of fluid and fetal movement were reviewed in detail with the patient.  Please refer to After Visit Summary for other counseling recommendations.   Return in about 3 weeks (around 09/29/2020) for 2 hr GTT, 3rd trim labs.   Mariel Aloe, MD Faculty Attending Center for [facility]

## 2020-09-08 NOTE — Patient Instructions (Signed)
Oral Glucose Tolerance Test During Pregnancy Why am I having this test? The oral glucose tolerance test (OGTT) is done to check how your body processes blood sugar (glucose). This is one of several tests used to diagnose diabetes that develops during pregnancy (gestational diabetes mellitus). Gestational diabetes is a short-term form of diabetes that some women develop while they are pregnant. It usually occurs during the second trimester of pregnancy and goes away after delivery. Testing, or screening, for gestational diabetes usually occurs at weeks 24-28 of pregnancy. You may have the OGTT test after having a 1-hour glucose screening test if the results from that test indicate that you may have gestational diabetes. This test may also be needed if:  You have a history of gestational diabetes.  There is a history of giving birth to very large babies or of losing pregnancies (having stillbirths).  You have signs and symptoms of diabetes, such as: ? Changes in your eyesight. ? Tingling or numbness in your hands or feet. ? Changes in hunger, thirst, and urination, and these are not explained by your pregnancy. What is being tested? This test measures the amount of glucose in your blood at different times during a period of 3 hours. This shows how well your body can process glucose. What kind of sample is taken? Blood samples are required for this test. They are usually collected by inserting a needle into a blood vessel.   How do I prepare for this test?  For 3 days before your test, eat normally. Have plenty of carbohydrate-rich foods.  Follow instructions from your health care provider about: ? Eating or drinking restrictions on the day of the test. You may be asked not to eat or drink anything other than water (to fast) starting 8-10 hours before the test. ? Changing or stopping your regular medicines. Some medicines may interfere with this test. Tell a health care provider about:  All  medicines you are taking, including vitamins, herbs, eye drops, creams, and over-the-counter medicines.  Any blood disorders you have.  Any surgeries you have had.  Any medical conditions you have. What happens during the test? First, your blood glucose will be measured. This is referred to as your fasting blood glucose because you fasted before the test. Then, you will drink a glucose solution that contains a certain amount of glucose. Your blood glucose will be measured again 1, 2, and 3 hours after you drink the solution. This test takes about 3 hours to complete. You will need to stay at the testing location during this time. During the testing period:  Do not eat or drink anything other than the glucose solution.  Do not exercise.  Do not use any products that contain nicotine or tobacco, such as cigarettes, e-cigarettes, and chewing tobacco. These can affect your test results. If you need help quitting, ask your health care provider. The testing procedure may vary among health care providers and hospitals. How are the results reported? Your results will be reported as milligrams of glucose per deciliter of blood (mg/dL) or millimoles per liter (mmol/L). There is more than one source for screening and diagnosis reference values used to diagnose gestational diabetes. Your health care provider will compare your results to normal values that were established after testing a large group of people (reference values). Reference values may vary among labs and hospitals. For this test (Carpenter-Coustan), reference values are:  Fasting: 95 mg/dL (5.3 mmol/L).  1 hour: 180 mg/dL (10.0 mmol/L).  2 hour:   155 mg/dL (8.6 mmol/L).  3 hour: 140 mg/dL (7.8 mmol/L). What do the results mean? Results below the reference values are considered normal. If two or more of your blood glucose levels are at or above the reference values, you may be diagnosed with gestational diabetes. If only one level is  high, your health care provider may suggest repeat testing or other tests to confirm a diagnosis. Talk with your health care provider about what your results mean. Questions to ask your health care provider Ask your health care provider, or the department that is doing the test:  When will my results be ready?  How will I get my results?  What are my treatment options?  What other tests do I need?  What are my next steps? Summary  The oral glucose tolerance test (OGTT) is one of several tests used to diagnose diabetes that develops during pregnancy (gestational diabetes mellitus). Gestational diabetes is a short-term form of diabetes that some women develop while they are pregnant.  You may have the OGTT test after having a 1-hour glucose screening test if the results from that test show that you may have gestational diabetes. You may also have this test if you have any symptoms or risk factors for this type of diabetes.  Talk with your health care provider about what your results mean. This information is not intended to replace advice given to you by your health care provider. Make sure you discuss any questions you have with your health care provider. Document Revised: 11/19/2019 Document Reviewed: 11/19/2019 Elsevier Patient Education  2021 Elsevier Inc.  

## 2020-09-08 NOTE — Progress Notes (Signed)
Addendum: taken to [employer] by pregnancy navigator today Arrin Ishler,RN

## 2020-09-08 NOTE — Progress Notes (Signed)
Had fever yesterday  Of 100.7 states from allergies, took tylenol and claritin and nasal lavage and felt better. Temperature today in office 98.2.  Camerin Ladouceur,RN

## 2020-09-26 ENCOUNTER — Other Ambulatory Visit: Payer: Self-pay | Admitting: Lactation Services

## 2020-09-26 DIAGNOSIS — O0990 Supervision of high risk pregnancy, unspecified, unspecified trimester: Secondary | ICD-10-CM

## 2020-09-29 ENCOUNTER — Other Ambulatory Visit: Payer: Self-pay

## 2020-09-29 ENCOUNTER — Ambulatory Visit (INDEPENDENT_AMBULATORY_CARE_PROVIDER_SITE_OTHER): Payer: Medicaid Other | Admitting: Obstetrics and Gynecology

## 2020-09-29 ENCOUNTER — Encounter: Payer: Self-pay | Admitting: Obstetrics and Gynecology

## 2020-09-29 ENCOUNTER — Other Ambulatory Visit: Payer: Medicaid Other

## 2020-09-29 VITALS — BP 126/85 | HR 98 | Wt 249.1 lb

## 2020-09-29 DIAGNOSIS — O0990 Supervision of high risk pregnancy, unspecified, unspecified trimester: Secondary | ICD-10-CM

## 2020-09-29 DIAGNOSIS — Z23 Encounter for immunization: Secondary | ICD-10-CM | POA: Diagnosis not present

## 2020-09-29 DIAGNOSIS — O09522 Supervision of elderly multigravida, second trimester: Secondary | ICD-10-CM

## 2020-09-29 DIAGNOSIS — O3662X1 Maternal care for excessive fetal growth, second trimester, fetus 1: Secondary | ICD-10-CM

## 2020-09-29 NOTE — Progress Notes (Signed)
Subjective:  Kenneisha Cochrane is a 35 y.o. G2P1001 at [redacted]w[redacted]d being seen today for ongoing prenatal care.  She is currently monitored for the following issues for this high-risk pregnancy and has Cholecystitis; Supervision of high risk pregnancy, antepartum; AMA (advanced maternal age) multigravida 35+; Obesity during pregnancy; Mild intermittent asthma without complication; Anxiety; Abdominal wall hernia; and Large for gestational age fetus affecting management of mother, antepartum, second trimester, fetus 1 on their problem list.  Patient reports general discomforts of pregnancy.  Contractions: Not present. Vag. Bleeding: None.  Movement: Present. Denies leaking of fluid.   The following portions of the patient's history were reviewed and updated as appropriate: allergies, current medications, past family history, past medical history, past social history, past surgical history and problem list. Problem list updated.  Objective:   Vitals:   09/29/20 0924  BP: 126/85  Pulse: 98  Weight: 249 lb 1.6 oz (113 kg)    Fetal Status: Fetal Heart Rate (bpm): 141   Movement: Present     General:  Alert, oriented and cooperative. Patient is in no acute distress.  Skin: Skin is warm and dry. No rash noted.   Cardiovascular: Normal heart rate noted  Respiratory: Normal respiratory effort, no problems with respiration noted  Abdomen: Soft, gravid, appropriate for gestational age. Pain/Pressure: Present     Pelvic:  Cervical exam deferred        Extremities: Normal range of motion.  Edema: None  Mental Status: Normal mood and affect. Normal behavior. Normal judgment and thought content.   Urinalysis:      Assessment and Plan:  Pregnancy: G2P1001 at [redacted]w[redacted]d  1. Supervision of high risk pregnancy, antepartum Stable Glucola today - Tdap vaccine greater than or equal to 7yo IM  2. Multigravida of advanced maternal age in second trimester Stable  3. Large for gestational age fetus affecting  management of mother, antepartum, second trimester, fetus 1 F/U growth scan ordered  Preterm labor symptoms and general obstetric precautions including but not limited to vaginal bleeding, contractions, leaking of fluid and fetal movement were reviewed in detail with the patient. Please refer to After Visit Summary for other counseling recommendations.  Return in about 2 weeks (around 10/13/2020) for OB visit, face to face, MD only.   Hermina Staggers, MD

## 2020-09-29 NOTE — Patient Instructions (Signed)

## 2020-09-30 LAB — CBC
Hematocrit: 32.9 % — ABNORMAL LOW (ref 34.0–46.6)
Hemoglobin: 10.7 g/dL — ABNORMAL LOW (ref 11.1–15.9)
MCH: 28.4 pg (ref 26.6–33.0)
MCHC: 32.5 g/dL (ref 31.5–35.7)
MCV: 87 fL (ref 79–97)
Platelets: 456 10*3/uL — ABNORMAL HIGH (ref 150–450)
RBC: 3.77 x10E6/uL (ref 3.77–5.28)
RDW: 12.8 % (ref 11.7–15.4)
WBC: 10.6 10*3/uL (ref 3.4–10.8)

## 2020-09-30 LAB — HIV ANTIBODY (ROUTINE TESTING W REFLEX): HIV Screen 4th Generation wRfx: NONREACTIVE

## 2020-09-30 LAB — RPR: RPR Ser Ql: NONREACTIVE

## 2020-09-30 LAB — GLUCOSE TOLERANCE, 2 HOURS W/ 1HR
Glucose, 1 hour: 168 mg/dL (ref 65–179)
Glucose, 2 hour: 109 mg/dL (ref 65–152)
Glucose, Fasting: 89 mg/dL (ref 65–91)

## 2020-10-17 ENCOUNTER — Ambulatory Visit (INDEPENDENT_AMBULATORY_CARE_PROVIDER_SITE_OTHER): Payer: Medicaid Other | Admitting: Obstetrics and Gynecology

## 2020-10-17 ENCOUNTER — Other Ambulatory Visit: Payer: Self-pay

## 2020-10-17 VITALS — BP 131/89 | HR 112 | Wt 247.8 lb

## 2020-10-17 DIAGNOSIS — O0990 Supervision of high risk pregnancy, unspecified, unspecified trimester: Secondary | ICD-10-CM

## 2020-10-17 DIAGNOSIS — O09522 Supervision of elderly multigravida, second trimester: Secondary | ICD-10-CM

## 2020-10-17 DIAGNOSIS — O3662X1 Maternal care for excessive fetal growth, second trimester, fetus 1: Secondary | ICD-10-CM

## 2020-10-17 LAB — CBC
Hematocrit: 33.6 % — ABNORMAL LOW (ref 34.0–46.6)
Hemoglobin: 11 g/dL — ABNORMAL LOW (ref 11.1–15.9)
MCH: 28.1 pg (ref 26.6–33.0)
MCHC: 32.7 g/dL (ref 31.5–35.7)
MCV: 86 fL (ref 79–97)
Platelets: 432 10*3/uL (ref 150–450)
RBC: 3.91 x10E6/uL (ref 3.77–5.28)
RDW: 13.5 % (ref 11.7–15.4)
WBC: 9 10*3/uL (ref 3.4–10.8)

## 2020-10-17 NOTE — Progress Notes (Signed)
Subjective:  Emily Davila is a 35 y.o. G2P1001 at [redacted]w[redacted]d being seen today for ongoing prenatal care.  She is currently monitored for the following issues for this high-risk pregnancy and has Supervision of high risk pregnancy, antepartum; AMA (advanced maternal age) multigravida 35+; Obesity during pregnancy; Mild intermittent asthma without complication; Anxiety; Abdominal wall hernia; and Large for gestational age fetus affecting management of mother, antepartum, second trimester, fetus 1 on their problem list.  Patient reports general discomforts of pregnancy.  Contractions: Irritability. Vag. Bleeding: None.  Movement: Present. Denies leaking of fluid.   The following portions of the patient's history were reviewed and updated as appropriate: allergies, current medications, past family history, past medical history, past social history, past surgical history and problem list. Problem list updated.  Objective:   Vitals:   10/17/20 1524 10/17/20 1527  BP: (!) 125/105 131/89  Pulse: (!) 109 (!) 112  Weight: 247 lb 12.8 oz (112.4 kg)     Fetal Status: Fetal Heart Rate (bpm): 142   Movement: Present     General:  Alert, oriented and cooperative. Patient is in no acute distress.  Skin: Skin is warm and dry. No rash noted.   Cardiovascular: Normal heart rate noted  Respiratory: Normal respiratory effort, no problems with respiration noted  Abdomen: Soft, gravid, appropriate for gestational age. Pain/Pressure: Present     Pelvic:  Cervical exam deferred        Extremities: Normal range of motion.  Edema: None  Mental Status: Normal mood and affect. Normal behavior. Normal judgment and thought content.   Urinalysis:      Assessment and Plan:  Pregnancy: G2P1001 at [redacted]w[redacted]d  1. Supervision of high risk pregnancy, antepartum Stable - CBC  2. Multigravida of advanced maternal age in second trimester Stable  3. Large for gestational age fetus affecting management of mother, antepartum,  second trimester, fetus 1 Growth scan next month  Preterm labor symptoms and general obstetric precautions including but not limited to vaginal bleeding, contractions, leaking of fluid and fetal movement were reviewed in detail with the patient. Please refer to After Visit Summary for other counseling recommendations.  Return in about 2 weeks (around 10/31/2020) for OB visit, face to face, MD only.   Hermina Staggers, MD

## 2020-10-17 NOTE — Patient Instructions (Signed)

## 2020-10-31 ENCOUNTER — Ambulatory Visit (INDEPENDENT_AMBULATORY_CARE_PROVIDER_SITE_OTHER): Payer: Medicaid Other | Admitting: Obstetrics and Gynecology

## 2020-10-31 ENCOUNTER — Other Ambulatory Visit: Payer: Self-pay

## 2020-10-31 VITALS — BP 125/84 | HR 98 | Wt 245.8 lb

## 2020-10-31 DIAGNOSIS — O3663X1 Maternal care for excessive fetal growth, third trimester, fetus 1: Secondary | ICD-10-CM

## 2020-10-31 DIAGNOSIS — O0990 Supervision of high risk pregnancy, unspecified, unspecified trimester: Secondary | ICD-10-CM

## 2020-10-31 DIAGNOSIS — Z3A31 31 weeks gestation of pregnancy: Secondary | ICD-10-CM | POA: Insufficient documentation

## 2020-10-31 DIAGNOSIS — J4520 Mild intermittent asthma, uncomplicated: Secondary | ICD-10-CM

## 2020-10-31 DIAGNOSIS — O09523 Supervision of elderly multigravida, third trimester: Secondary | ICD-10-CM

## 2020-10-31 DIAGNOSIS — O99210 Obesity complicating pregnancy, unspecified trimester: Secondary | ICD-10-CM

## 2020-10-31 NOTE — Progress Notes (Signed)
   PRENATAL VISIT NOTE  Subjective:  Emily Davila is a 35 y.o. G2P1001 at [redacted]w[redacted]d being seen today for ongoing prenatal care.  She is currently monitored for the following issues for this high-risk pregnancy and has Supervision of high risk pregnancy, antepartum; AMA (advanced maternal age) multigravida 35+; Obesity during pregnancy; Mild intermittent asthma without complication; Anxiety; Abdominal wall hernia; Large for gestational age fetus affecting management of mother, antepartum, third trimester, fetus 1; and [redacted] weeks gestation of pregnancy on their problem list.  Patient doing well with no acute concerns today. She reports occasional contractions.  Contractions: Irritability. Vag. Bleeding: None.  Movement: Present. Denies leaking of fluid.   The following portions of the patient's history were reviewed and updated as appropriate: allergies, current medications, past family history, past medical history, past social history, past surgical history and problem list. Problem list updated.  Objective:   Vitals:   10/31/20 1616  BP: 125/84  Pulse: 98  Weight: 245 lb 12.8 oz (111.5 kg)    Fetal Status: Fetal Heart Rate (bpm): 140 Fundal Height: 34 cm Movement: Present     General:  Alert, oriented and cooperative. Patient is in no acute distress.  Skin: Skin is warm and dry. No rash noted.   Cardiovascular: Normal heart rate noted  Respiratory: Normal respiratory effort, no problems with respiration noted  Abdomen: Soft, gravid, appropriate for gestational age.  Pain/Pressure: Present     Pelvic: Cervical exam deferred        Extremities: Normal range of motion.  Edema: None  Mental Status:  Normal mood and affect. Normal behavior. Normal judgment and thought content.   Assessment and Plan:  Pregnancy: G2P1001 at [redacted]w[redacted]d  1. Multigravida of advanced maternal age in third trimester   2. Supervision of high risk pregnancy, antepartum Continue routine care  3. Mild intermittent  asthma without complication   4. [redacted] weeks gestation of pregnancy   5. Obesity during pregnancy   6. Large for gestational age fetus affecting management of mother, antepartum, third trimester, fetus 1 Fetal growth on 5/16  Preterm labor symptoms and general obstetric precautions including but not limited to vaginal bleeding, contractions, leaking of fluid and fetal movement were reviewed in detail with the patient.  Please refer to After Visit Summary for other counseling recommendations.   Return in about 2 weeks (around 11/14/2020) for [facility], in person.   Mariel Aloe, MD Faculty Attending [facility]

## 2020-11-07 ENCOUNTER — Ambulatory Visit: Payer: Medicaid Other | Attending: Obstetrics

## 2020-11-07 ENCOUNTER — Other Ambulatory Visit: Payer: Self-pay | Admitting: *Deleted

## 2020-11-07 ENCOUNTER — Encounter: Payer: Self-pay | Admitting: *Deleted

## 2020-11-07 ENCOUNTER — Ambulatory Visit: Payer: Medicaid Other | Admitting: *Deleted

## 2020-11-07 ENCOUNTER — Other Ambulatory Visit: Payer: Self-pay

## 2020-11-07 DIAGNOSIS — O99213 Obesity complicating pregnancy, third trimester: Secondary | ICD-10-CM

## 2020-11-07 DIAGNOSIS — O99210 Obesity complicating pregnancy, unspecified trimester: Secondary | ICD-10-CM

## 2020-11-07 DIAGNOSIS — O321XX0 Maternal care for breech presentation, not applicable or unspecified: Secondary | ICD-10-CM

## 2020-11-07 DIAGNOSIS — O3663X0 Maternal care for excessive fetal growth, third trimester, not applicable or unspecified: Secondary | ICD-10-CM

## 2020-11-07 DIAGNOSIS — O99212 Obesity complicating pregnancy, second trimester: Secondary | ICD-10-CM

## 2020-11-07 DIAGNOSIS — O09522 Supervision of elderly multigravida, second trimester: Secondary | ICD-10-CM

## 2020-11-07 DIAGNOSIS — O0990 Supervision of high risk pregnancy, unspecified, unspecified trimester: Secondary | ICD-10-CM

## 2020-11-07 DIAGNOSIS — O09523 Supervision of elderly multigravida, third trimester: Secondary | ICD-10-CM

## 2020-11-07 DIAGNOSIS — E669 Obesity, unspecified: Secondary | ICD-10-CM | POA: Diagnosis not present

## 2020-11-07 DIAGNOSIS — Z3A32 32 weeks gestation of pregnancy: Secondary | ICD-10-CM

## 2020-11-07 DIAGNOSIS — Z362 Encounter for other antenatal screening follow-up: Secondary | ICD-10-CM | POA: Diagnosis not present

## 2020-11-09 ENCOUNTER — Telehealth: Payer: Self-pay | Admitting: *Deleted

## 2020-11-09 ENCOUNTER — Encounter (HOSPITAL_COMMUNITY): Payer: Self-pay | Admitting: Obstetrics and Gynecology

## 2020-11-09 ENCOUNTER — Inpatient Hospital Stay (HOSPITAL_COMMUNITY)
Admission: AD | Admit: 2020-11-09 | Discharge: 2020-11-09 | Disposition: A | Discharge status: Home / Self Care | Payer: Medicaid Other | Attending: Obstetrics and Gynecology | Admitting: Obstetrics and Gynecology

## 2020-11-09 DIAGNOSIS — Z3A32 32 weeks gestation of pregnancy: Secondary | ICD-10-CM

## 2020-11-09 DIAGNOSIS — Z881 Allergy status to other antibiotic agents status: Secondary | ICD-10-CM | POA: Insufficient documentation

## 2020-11-09 DIAGNOSIS — O99210 Obesity complicating pregnancy, unspecified trimester: Secondary | ICD-10-CM

## 2020-11-09 DIAGNOSIS — Z3A33 33 weeks gestation of pregnancy: Secondary | ICD-10-CM

## 2020-11-09 DIAGNOSIS — O0990 Supervision of high risk pregnancy, unspecified, unspecified trimester: Secondary | ICD-10-CM

## 2020-11-09 DIAGNOSIS — O133 Gestational [pregnancy-induced] hypertension without significant proteinuria, third trimester: Secondary | ICD-10-CM

## 2020-11-09 DIAGNOSIS — Z87891 Personal history of nicotine dependence: Secondary | ICD-10-CM | POA: Insufficient documentation

## 2020-11-09 LAB — CBC
HCT: 32.6 % — ABNORMAL LOW (ref 36.0–46.0)
Hemoglobin: 10.6 g/dL — ABNORMAL LOW (ref 12.0–15.0)
MCH: 27.7 pg (ref 26.0–34.0)
MCHC: 32.5 g/dL (ref 30.0–36.0)
MCV: 85.1 fL (ref 80.0–100.0)
Platelets: 407 10*3/uL — ABNORMAL HIGH (ref 150–400)
RBC: 3.83 MIL/uL — ABNORMAL LOW (ref 3.87–5.11)
RDW: 14.6 % (ref 11.5–15.5)
WBC: 9 10*3/uL (ref 4.0–10.5)
nRBC: 0 % (ref 0.0–0.2)

## 2020-11-09 LAB — PROTEIN / CREATININE RATIO, URINE
Creatinine, Urine: 111.95 mg/dL
Protein Creatinine Ratio: 0.15 mg/mg{Cre} (ref 0.00–0.15)
Total Protein, Urine: 17 mg/dL

## 2020-11-09 LAB — COMPREHENSIVE METABOLIC PANEL
ALT: 19 U/L (ref 0–44)
AST: 16 U/L (ref 15–41)
Albumin: 2.5 g/dL — ABNORMAL LOW (ref 3.5–5.0)
Alkaline Phosphatase: 78 U/L (ref 38–126)
Anion gap: 7 (ref 5–15)
BUN: 6 mg/dL (ref 6–20)
CO2: 24 mmol/L (ref 22–32)
Calcium: 9.2 mg/dL (ref 8.9–10.3)
Chloride: 105 mmol/L (ref 98–111)
Creatinine, Ser: 0.59 mg/dL (ref 0.44–1.00)
GFR, Estimated: >60 mL/min (ref >60)
Glucose, Bld: 123 mg/dL — ABNORMAL HIGH (ref 70–99)
Potassium: 3.6 mmol/L (ref 3.5–5.1)
Sodium: 136 mmol/L (ref 135–145)
Total Bilirubin: 0.4 mg/dL (ref 0.3–1.2)
Total Protein: 6.1 g/dL — ABNORMAL LOW (ref 6.5–8.1)

## 2020-11-09 MED ORDER — ACETAMINOPHEN 500 MG PO TABS
1000.0000 mg | ORAL_TABLET | ORAL | Status: AC
  Administered 2020-11-09: 1000 mg via ORAL
  Filled 2020-11-09: qty 2

## 2020-11-09 NOTE — MAU Provider Note (Signed)
History     CSN: [id number]  Arrival date and time: 11/09/20 0056   None     Chief Complaint  Patient presents with  . Hypertension   HPI  Patient is a g2p1001 at [redacted]w[redacted]d who presents today after noticing she had elevated blood pressures at home. On review of chart, patient has had multiple elevated blood pressures since at least [redacted] weeks gestation. She reports that she has been checking BP at home and has had multiple pressures of 140's/90's. She says she had no prior blood pressure issues before pregnancy. Says she has a history of migraines and tension headaches and had a mild headache earlier that has improved. Denies blurry vision. Denies RUQ pain. Denies increased swelling. Denies SOB.   OB History    Gravida  2   Para  1   Term  1   Preterm  0   AB  0   Living  1     SAB  0   IAB  0   Ectopic  0   Multiple  0   Live Births  1           Past Medical History:  Diagnosis Date  . Anxiety 09/02/2015  . Asthma   . BV (bacterial vaginosis)   . E. coli UTI   . H/O seasonal allergies   . Headache(784.0)   . PID (acute pelvic inflammatory disease)     Past Surgical History:  Procedure Laterality Date  . CHOLECYSTECTOMY  04/29/2011   Procedure: LAPAROSCOPIC CHOLECYSTECTOMY WITH INTRAOPERATIVE CHOLANGIOGRAM;  Surgeon: Wilmon Arms. Corliss Skains, MD;  Location: [facility] OR;  Service: General;  Laterality: N/A;  latex allergy, inpatient 5127  . FOOT SURGERY      Family History  Adopted: Yes  Problem Relation Age of Onset  . Graves' disease Father     Social History   Tobacco Use  . Smoking status: Former Smoker    Types: Cigarettes    Quit date: 08/2018    Years since quitting: 2.2  . Smokeless tobacco: Never Used  Vaping Use  . Vaping Use: Never used  Substance Use Topics  . Alcohol use: No  . Drug use: Not Currently    Types: Marijuana    Comment: LAST SMOKED - NONE WHILE PREG    Allergies:  Allergies  Allergen Reactions  . Latex Itching  . Keflex  [Cephalexin] Itching    Medications Prior to Admission  Medication Sig Dispense Refill Last Dose  . albuterol (VENTOLIN HFA) 108 (90 Base) MCG/ACT inhaler Inhale 1 puff into the lungs every 4 (four) hours as needed for wheezing or shortness of breath. 1 each 1 Past Month at Unknown time  . loratadine (CLARITIN) 10 MG tablet Take 10 mg by mouth daily.   11/08/2020 at Unknown time  . Prenatal Vit-Fe Fumarate-FA (PRENATAL VITAMIN PO) Take 1 tablet by mouth daily.   11/08/2020 at Unknown time    Review of Systems  Constitutional: Negative for chills and fever.  Cardiovascular: Negative for chest pain.  Gastrointestinal: Negative for abdominal pain and nausea.  Neurological: Negative for dizziness and headaches.   Physical Exam   Blood pressure (!) 144/91, pulse (!) 101, temperature 98.1 F (36.7 C), temperature source Oral, resp. rate 20, height 5\' 6"  (1.676 m), weight 113.9 kg, last menstrual period 03/24/2020.  Physical Exam Vitals and nursing note reviewed.  Constitutional:      Appearance: Normal appearance.  HENT:     Head: Normocephalic and atraumatic.  Abdominal:  Palpations: Abdomen is soft.     Tenderness: There is no abdominal tenderness.  Musculoskeletal:        General: Normal range of motion.  Skin:    General: Skin is warm and dry.  Neurological:     General: No focal deficit present.     Mental Status: She is alert and oriented to person, place, and time.     Comments: Biceps, patellar reflexes are 2+ and symmetric. No clonus.   Psychiatric:        Mood and Affect: Mood normal.        Behavior: Behavior normal.     MAU Course  Procedures  MDM Pt evaluated at bedside BP here is 130/90s, 140/90s  PEC labs drawn  Labs normal - plt 407, AST/ALT 16/19, UPC .15 Stable for DC home    Assessment and Plan   Gestational HTN  -discussed diagnosis w patient, has appointment 5/25. Already enrolled in [facility] -Placed order to start weekly BPP  -return  precautions provided, stable for d/c home     Gita Kudo 11/09/2020, 1:41 AM

## 2020-11-09 NOTE — Telephone Encounter (Signed)
Received a call from [facility] for BP alert of 146/107. Joevanni Roddey,RN

## 2020-11-09 NOTE — MAU Note (Addendum)
PT SAYS SHE CALLED DR BASS- AT - ABOUT HER BP- TOLD TO COME IN  PNC WITH CLINIC  HAS H/A- STARTED WHEN DR TOLD HER TO COME TO HOSPITAL  NO VISION PROBLEMS- OR EPIGASTRIC PAIN  FEELS OCC UC'S

## 2020-11-09 NOTE — Telephone Encounter (Signed)
Called patient in regards to most recent BP. Patient denies any HA, blurred vision or dizziness.   Patient was seen in the hospital last night. She was informed that as long as it is less than 160/110 to continue to document and did not need to return for care.   Baby is moving a lot per mom.   Spoke with Dr. Debroah Loop who also recommends that she return to MAU for BP > 160/110 or if BP >140/90 and accompanied by HA that is not resolved by Tylenol. Blurred vision, Dizziness or new swelling. Patient voiced understanding.   Patient to take BP daily and enter into [employer]. Patient informed that we will call each time her BP is > 140-90 to assess her symptoms. Patient voiced understanding.

## 2020-11-09 NOTE — Discharge Instructions (Signed)
Preeclampsia and Eclampsia Preeclampsia is a serious condition that may develop during pregnancy. This condition involves high blood pressure during pregnancy and causes symptoms such as headaches, vision changes, and increased swelling in the legs, hands, and face. Preeclampsia occurs after 20 weeks of pregnancy. Eclampsia is a seizure that happens from worsening preeclampsia. Diagnosing and managing preeclampsia early is important. If not treated early, it can cause serious problems for mother and baby. There is no cure for this condition. However, during pregnancy, delivering the baby may be the best treatment for preeclampsia or eclampsia. For most women, symptoms of preeclampsia and eclampsia go away after giving birth. In rare cases, a woman may develop preeclampsia or eclampsia after giving birth. This usually occurs within 48 hours after childbirth but may occur up to 6 weeks after giving birth. What are the causes? The cause of this condition is not known. What increases the risk? The following factors make you more likely to develop preeclampsia:  Being pregnant for the first time or being pregnant with multiples.  Having had preeclampsia or a condition called hemolysis, elevated liver enzymes, and low platelet count (HELLP)syndrome during a past pregnancy.  Having a family history of preeclampsia.  Being older than age 35.  Being obese.  Becoming pregnant through fertility treatments. Conditions that reduce blood flow or oxygen to your placenta and baby may also increase your risk. These include:  High blood pressure before, during, or immediately following pregnancy.  Kidney disease.  Diabetes.  Blood clotting disorders.  Autoimmune diseases, such as lupus.  Sleep apnea. What are the signs or symptoms? Common symptoms of this condition include:  A severe, throbbing headache that does not go away.  Vision problems, such as blurred or double vision and light  sensitivity.  Pain in the stomach, especially the right upper region.  Pain in the shoulder. Other symptoms that may develop as the condition gets worse include:  Sudden weight gain because of fluid buildup in the body. This causes swelling of the face, hands, legs, and feet.  Severe nausea and vomiting.  Urinating less than usual.  Shortness of breath.  Seizures. How is this diagnosed? Your health care provider will ask you about symptoms and check for signs of preeclampsia during your prenatal visits. You will also have routine tests, including:  Checking your blood pressure.  Urine tests to check for protein.  Blood tests to assess your organ function.  Monitoring your baby's heart rate.  Ultrasounds to check fetal growth.   How is this treated? You and your health care provider will determine the treatment that is best for you. Treatment may include:  Frequent prenatal visits to check for preeclampsia.  Medicine to lower your blood pressure.  Medicine to prevent seizures.  Low-dose aspirin during your pregnancy.  Staying in the hospital, in severe cases. You will be given medicines to control your blood pressure and the amount of fluids in your body.  Delivering your baby. Work with your health care provider to manage any chronic health conditions, such as diabetes or kidney problems. Also, work with your health care provider to manage weight gain during pregnancy. Follow these instructions at home: Eating and drinking  Drink enough fluid to keep your urine pale yellow.  Avoid caffeine. Caffeine may increase blood pressure and heart rate and lead to dehydration.  Reduce the amount of salt that you eat. Lifestyle  Do not use any products that contain nicotine or tobacco. These products include cigarettes, chewing tobacco, and   vaping devices, such as e-cigarettes. If you need help quitting, ask your health care provider.  Do not use alcohol or drugs.  Avoid  stress as much as possible.  Rest and get plenty of sleep. General instructions  Take over-the-counter and prescription medicines only as told by your health care provider.  When lying down, lie on your left side. This keeps pressure off your major blood vessels.  When sitting or lying down, raise (elevate) your feet. Try putting pillows underneath your lower legs.  Exercise regularly. Ask your health care provider what kinds of exercise are best for you.  Check your blood pressure as often as recommended by your health care provider.  Keep all prenatal and follow-up visits. This is important.   Contact a health care provider if:  You have symptoms that may need treatment or closer monitoring. These include: ? Headaches. ? Stomach pain or nausea and vomiting. ? Shoulder pain. ? Vision problems, such as spots in front of your eyes or blurry vision. ? Sudden weight gain or increased swelling in your face, hands, legs, and feet. ? Increased anxiety or feeling of impending doom. ? Signs or symptoms of labor. Get help right away if:  You have any of the following symptoms: ? A seizure. ? Shortness of breath or trouble breathing. ? Trouble speaking or slurred speech. ? Fainting. ? Chest pain. These symptoms may represent a serious problem that is an emergency. Do not wait to see if the symptoms will go away. Get medical help right away. Call your local emergency services (911 in the [place] Do not drive yourself to the hospital. Summary  Preeclampsia is a serious condition that may develop during pregnancy.  Diagnosing and treating preeclampsia early is very important.  Keep all prenatal and follow-up visits. This is important.  Get help right away if you have a seizure, shortness of breath or trouble breathing, trouble speaking or slurred speech, chest pain, or fainting. This information is not intended to replace advice given to you by your health care provider. Make sure you  discuss any questions you have with your health care provider. Document Revised: 03/03/2020 Document Reviewed: 03/03/2020 Elsevier Patient Education  2021 [employer]  

## 2020-11-10 ENCOUNTER — Telehealth: Payer: Self-pay | Admitting: *Deleted

## 2020-11-10 NOTE — Telephone Encounter (Signed)
Received a call from [facility] with Alert for BP today of 169/102. I called Emily Davila and she reports good fetal movement, denies headache, denies blurry vision , denies edema. I reviewed with her recommendations from MAU visit 11/09/20 early am to continue checking bp daily and keep already scheduled ob fu for 11/16/20 and that she will start weekly nst/ bpp.  I reviewed recommendation from Dr. Debroah Loop that if BP elevated above 160/110 or severe headache not relieve by tylenol or sudden edema to go to MAU for evaluation. I informed her that means we recommend going to MAU today for evaluation.  We also recommend goiing to MAU if she has decreased fetal movement. I advised her elevated blood pressure can be an life- threatening emergency for her or her baby.  I informed her I will review with another doctor and if any other new orders, I will call her back.  I reviewed patient chart, assessment with Dr. Donavan Foil. He advised [place] to check her bp cuff at her next visit to see if it is large enough for her and accurate I called Emily Davila back and asked her to bring her cuff with her to next visit so we can make sure it is right size and accurate. She states she has several. I asked her to bring with her and she states " Not a problem". I also informed her per registrar we cant schedule her for nst/bpp at same time as appointment, can do am same day or pm next day. She elects to do 11/17/20 pm. She voices understanding of plan of care.  Bonita Quin, RN

## 2020-11-11 ENCOUNTER — Telehealth: Payer: Self-pay

## 2020-11-11 ENCOUNTER — Inpatient Hospital Stay (HOSPITAL_COMMUNITY)
Admission: AD | Admit: 2020-11-11 | Discharge: 2020-11-11 | Disposition: A | Discharge status: Home / Self Care | Payer: Medicaid Other | Attending: Obstetrics and Gynecology | Admitting: Obstetrics and Gynecology

## 2020-11-11 ENCOUNTER — Other Ambulatory Visit: Payer: Self-pay

## 2020-11-11 ENCOUNTER — Encounter (HOSPITAL_COMMUNITY): Payer: Self-pay | Admitting: Obstetrics and Gynecology

## 2020-11-11 DIAGNOSIS — O133 Gestational [pregnancy-induced] hypertension without significant proteinuria, third trimester: Secondary | ICD-10-CM | POA: Diagnosis not present

## 2020-11-11 DIAGNOSIS — Z3A33 33 weeks gestation of pregnancy: Secondary | ICD-10-CM | POA: Diagnosis not present

## 2020-11-11 DIAGNOSIS — Z881 Allergy status to other antibiotic agents status: Secondary | ICD-10-CM | POA: Diagnosis not present

## 2020-11-11 DIAGNOSIS — R519 Headache, unspecified: Secondary | ICD-10-CM | POA: Diagnosis not present

## 2020-11-11 DIAGNOSIS — Z9049 Acquired absence of other specified parts of digestive tract: Secondary | ICD-10-CM | POA: Diagnosis not present

## 2020-11-11 DIAGNOSIS — O26893 Other specified pregnancy related conditions, third trimester: Secondary | ICD-10-CM | POA: Diagnosis not present

## 2020-11-11 DIAGNOSIS — Z87891 Personal history of nicotine dependence: Secondary | ICD-10-CM | POA: Insufficient documentation

## 2020-11-11 DIAGNOSIS — O139 Gestational [pregnancy-induced] hypertension without significant proteinuria, unspecified trimester: Secondary | ICD-10-CM

## 2020-11-11 HISTORY — DX: Gestational (pregnancy-induced) hypertension without significant proteinuria, unspecified trimester: O13.9

## 2020-11-11 LAB — COMPREHENSIVE METABOLIC PANEL
ALT: 19 U/L (ref 0–44)
AST: 18 U/L (ref 15–41)
Albumin: 2.5 g/dL — ABNORMAL LOW (ref 3.5–5.0)
Alkaline Phosphatase: 84 U/L (ref 38–126)
Anion gap: 8 (ref 5–15)
BUN: 7 mg/dL (ref 6–20)
CO2: 20 mmol/L — ABNORMAL LOW (ref 22–32)
Calcium: 9 mg/dL (ref 8.9–10.3)
Chloride: 103 mmol/L (ref 98–111)
Creatinine, Ser: 0.52 mg/dL (ref 0.44–1.00)
GFR, Estimated: >60 mL/min (ref >60)
Glucose, Bld: 108 mg/dL — ABNORMAL HIGH (ref 70–99)
Potassium: 3.4 mmol/L — ABNORMAL LOW (ref 3.5–5.1)
Sodium: 131 mmol/L — ABNORMAL LOW (ref 135–145)
Total Bilirubin: 0.5 mg/dL (ref 0.3–1.2)
Total Protein: 6.2 g/dL — ABNORMAL LOW (ref 6.5–8.1)

## 2020-11-11 LAB — CBC
HCT: 33.2 % — ABNORMAL LOW (ref 36.0–46.0)
Hemoglobin: 10.6 g/dL — ABNORMAL LOW (ref 12.0–15.0)
MCH: 27.4 pg (ref 26.0–34.0)
MCHC: 31.9 g/dL (ref 30.0–36.0)
MCV: 85.8 fL (ref 80.0–100.0)
Platelets: 426 10*3/uL — ABNORMAL HIGH (ref 150–400)
RBC: 3.87 MIL/uL (ref 3.87–5.11)
RDW: 14.6 % (ref 11.5–15.5)
WBC: 8.8 10*3/uL (ref 4.0–10.5)
nRBC: 0 % (ref 0.0–0.2)

## 2020-11-11 LAB — PROTEIN / CREATININE RATIO, URINE
Creatinine, Urine: 174.62 mg/dL
Protein Creatinine Ratio: 0.15 mg/mg{Cre} (ref 0.00–0.15)
Total Protein, Urine: 27 mg/dL

## 2020-11-11 LAB — URINALYSIS, ROUTINE W REFLEX MICROSCOPIC
Bilirubin Urine: NEGATIVE
Glucose, UA: NEGATIVE mg/dL
Hgb urine dipstick: NEGATIVE
Ketones, ur: NEGATIVE mg/dL
Leukocytes,Ua: NEGATIVE
Nitrite: POSITIVE — AB
Protein, ur: 30 mg/dL — AB
Specific Gravity, Urine: 1.018 (ref 1.005–1.030)
pH: 7 (ref 5.0–8.0)

## 2020-11-11 MED ORDER — ACETAMINOPHEN 500 MG PO TABS
1000.0000 mg | ORAL_TABLET | Freq: Once | ORAL | Status: AC
  Administered 2020-11-11: 1000 mg via ORAL
  Filled 2020-11-11: qty 2

## 2020-11-11 NOTE — MAU Note (Signed)
Alton Bice is a 35 y.o. at [redacted]w[redacted]d here in MAU reporting: took BP this AM and it was 156/96, 168/102, 154/116, 161/114, 152/122, and 151/113. Currently has a headache and states she feels like she is under water. Having some cramping. No bleeding or LOF. +FM  Onset of complaint: today  Pain score: cramping 5/10, headache 7/10  Vitals:   11/11/20 1737  BP: (!) 135/96  Pulse: (!) 109  Resp: 18  Temp: 98.1 F (36.7 C)  SpO2: 100%     FHT:160  Lab orders placed from triage: [facility]

## 2020-11-11 NOTE — Telephone Encounter (Signed)
Alert received from [facility]. Called pt in regards to elevated BP of 157/114. Pt reports this was just taken and recorded in [facility]. I reviewed Dr. Olivia Mackie recommendation of returning to MAU for BP 160/110 or higher. Pt agrees to recheck BP in 30 minutes and to present to MAU for evaluation if either diastolic or systolic number is still elevated. Reviewed symptoms of hypertension. Pt denies any symptoms. Reviewed that pt should go to MAU if she experiences any symptoms and BP is 140/90 or higher. Reiterated that elevated BP can be a sign of pre-eclampsia which can be a life threatening condition if not treated. Pt verbalizes understanding.

## 2020-11-11 NOTE — Discharge Instructions (Signed)
Hypertension During Pregnancy Hypertension is also called high blood pressure. High blood pressure means that the force of the blood moving in your body is high enough to cause problems for you and your baby. Different types of high blood pressure can happen during pregnancy. The types are:  High blood pressure before you got pregnant. This is called chronic hypertension.  This can continue during your pregnancy. Your doctor will want to keep checking your blood pressure. You may need medicine to control your blood pressure while you are pregnant. You will need follow-up visits after you have your baby.  High blood pressure that goes up during pregnancy when it was normal before. This is called gestational hypertension. It will often get better after you have your baby, but your doctor will need to watch your blood pressure to make sure that it is getting better.  You may develop high blood pressure after giving birth. This is called postpartum hypertension. This often occurs within 48 hours after childbirth but may occur up to 6 weeks after giving birth. Very high blood pressure during pregnancy is an emergency that needs treatment right away. How does this affect me? If you have high blood pressure during pregnancy, you have a higher chance of developing high blood pressure:  As you get older.  If you get pregnant again. In some cases, high blood pressure during pregnancy can cause:  Stroke.  Heart attack.  Damage to the kidneys, lungs, or liver.  Preeclampsia.  HELLP syndrome.  Seizures.  Problems with the placenta. How does this affect my baby? Your baby may:  Be born early.  Not weigh as much as he or she should.  Not handle labor well, leading to a C-section. This condition may also result in a baby's death before birth (stillbirth). What are the risks?  Having high blood pressure during a past pregnancy.  Being overweight.  Being age 35 or older.  Being pregnant  for the first time.  Being pregnant with more than one baby.  Becoming pregnant using fertility methods, such as IVF.  Having other problems, such as diabetes or kidney disease. What can I do to lower my risk?  Keep a healthy weight.  Eat a healthy diet.  Follow what your doctor tells you about treating any medical problems that you had before you got pregnant. It is very important to go to all of your doctor visits. Your doctor will check your blood pressure and make sure that your pregnancy is progressing as it should. Treatment should start early if a problem is found.   How is this treated? Treatment for high blood pressure during pregnancy can vary. It depends on the type of high blood pressure you have and how serious it is.  If you were taking medicine for your blood pressure before you got pregnant, talk with your doctor. You may need to change the medicine during pregnancy if it is not safe for your baby.  If your blood pressure goes up during pregnancy, your doctor may order medicine to treat this.  If you are at risk for preeclampsia, your doctor may tell you to take a low-dose aspirin while you are pregnant.  If you have very high blood pressure, you may need to stay in the hospital so you and your baby can be watched closely. You may also need to take medicine to lower your blood pressure.  In some cases, if your condition gets worse, you may need to have your baby early.   Follow these instructions at home: Eating and drinking  Drink enough fluid to keep your pee (urine) pale yellow.  Avoid caffeine.   Lifestyle  Do not smoke or use any products that contain nicotine or tobacco. If you need help quitting, ask your doctor.  Do not use alcohol or drugs.  Avoid stress.  Rest and get plenty of sleep.  Regular exercise can help. Ask your doctor what kinds of exercise are best for you. General instructions  Take over-the-counter and prescription medicines only as  told by your doctor.  Keep all prenatal and follow-up visits. Contact a doctor if:  You have symptoms that your doctor told you to watch for, such as: ? Headaches. ? A feeling like you may vomit (nausea). ? Vomiting. ? Belly (abdominal) pain. ? Feeling dizzy or light-headed. Get help right away if:  You have symptoms of serious problems, such as: ? Very bad belly pain that does not get better with treatment. ? A very bad headache that does not get better. ? Blurry vision. ? Double vision. ? Vomiting that does not get better. ? Sudden, fast weight gain. ? Sudden swelling in your hands, ankles, or face. ? Bleeding from your vagina. ? Blood in your pee. ? Shortness of breath. ? Chest pain. ? Weakness on one side of your body. ? Trouble talking.  Your baby is not moving as much as usual. These symptoms may be an emergency. Get help right away. Call your local emergency services (911 in the [place]  Do not wait to see if the symptoms will go away.  Do not drive yourself to the hospital. Summary  High blood pressure is also called hypertension.  High blood pressure means that the force of the blood moving in your body is high enough to cause problems for you and your baby.  Get help right away if you have symptoms of serious problems due to high blood pressure.  Keep all prenatal and follow-up visits. This information is not intended to replace advice given to you by your health care provider. Make sure you discuss any questions you have with your health care provider. Document Revised: 03/03/2020 Document Reviewed: 03/03/2020 Elsevier Patient Education  2021 Elsevier Inc.  

## 2020-11-11 NOTE — MAU Provider Note (Signed)
History     CSN: [id number]  Arrival date and time: 11/11/20 1715   None     Chief Complaint  Patient presents with  . Abdominal Pain  . Hypertension  . Headache   HPI  Emily Davila is a 35 y.o. female G2P1001 @ [redacted]w[redacted]d with GHTN here with elevated BP with home cuff and HA.  Reports HA today, has not tried anything for the HA.  HA started after 1500 today. She currently rates her headache 11/29/08.  Hx of HA's; this is similar. No photophobia, no N/V.   Home BP readings were 166/122 Was the highest BP reading she got at home then 152/122.  She was just here in MAU on 5/18 for elevated BP readings and diagnosed with [facility]  She is concerned her home cuff is not accurate.  146/104- home BP cuff left arm. She reports buying a brand new cuff at home 171/104- 2nd home cuff.  138/92- MAU cuff left arm; manual BP reading in MAU matched MAU machine reading.   OB History    Gravida  2   Para  1   Term  1   Preterm  0   AB  0   Living  1     SAB  0   IAB  0   Ectopic  0   Multiple  0   Live Births  1           Past Medical History:  Diagnosis Date  . Anxiety 09/02/2015  . Asthma   . BV (bacterial vaginosis)   . E. coli UTI   . H/O seasonal allergies   . Headache(784.0)   . PID (acute pelvic inflammatory disease)   . Pregnancy induced hypertension     Past Surgical History:  Procedure Laterality Date  . CHOLECYSTECTOMY  04/29/2011   Procedure: LAPAROSCOPIC CHOLECYSTECTOMY WITH INTRAOPERATIVE CHOLANGIOGRAM;  Surgeon: Wilmon Arms. Corliss Skains, MD;  Location: [facility] OR;  Service: General;  Laterality: N/A;  latex allergy, inpatient 5127  . FOOT SURGERY      Family History  Adopted: Yes  Problem Relation Age of Onset  . Graves' disease Mother     Social History   Tobacco Use  . Smoking status: Former Smoker    Types: Cigarettes    Quit date: 08/2018    Years since quitting: 2.2  . Smokeless tobacco: Never Used  Vaping Use  . Vaping Use: Never used   Substance Use Topics  . Alcohol use: No  . Drug use: Not Currently    Types: Marijuana    Comment: LAST SMOKED - NONE WHILE PREG    Allergies:  Allergies  Allergen Reactions  . Latex Itching  . Keflex [Cephalexin] Itching    Medications Prior to Admission  Medication Sig Dispense Refill Last Dose  . acetaminophen (TYLENOL) 500 MG tablet Take 1,000 mg by mouth every 6 (six) hours as needed.   11/10/2020 at Unknown time  . albuterol (VENTOLIN HFA) 108 (90 Base) MCG/ACT inhaler Inhale 1 puff into the lungs every 4 (four) hours as needed for wheezing or shortness of breath. 1 each 1 Past Week at Unknown time  . loratadine (CLARITIN) 10 MG tablet Take 10 mg by mouth daily.   11/10/2020 at Unknown time  . Prenatal Vit-Fe Fumarate-FA (PRENATAL VITAMIN PO) Take 1 tablet by mouth daily.   11/11/2020 at Unknown time   Results for orders placed or performed during the hospital encounter of 11/11/20 (from the past 48 hour(s))  Protein /  creatinine ratio, urine     Status: None   Collection Time: 11/11/20  5:23 PM  Result Value Ref Range   Creatinine, Urine 174.62 mg/dL   Total Protein, Urine 27 mg/dL    Comment: NO NORMAL RANGE ESTABLISHED FOR THIS TEST   Protein Creatinine Ratio 0.15 0.00 - 0.15 mg/mg[Cre]    Comment: Performed at [facility], [address], [place], [place] [address]  Urinalysis, Routine w reflex microscopic     Status: Abnormal   Collection Time: 11/11/20  5:25 PM  Result Value Ref Range   Color, Urine YELLOW YELLOW   APPearance HAZY (A) CLEAR   Specific Gravity, Urine 1.018 1.005 - 1.030   pH 7.0 5.0 - 8.0   Glucose, UA NEGATIVE NEGATIVE mg/dL   Hgb urine dipstick NEGATIVE NEGATIVE   Bilirubin Urine NEGATIVE NEGATIVE   Ketones, ur NEGATIVE NEGATIVE mg/dL   Protein, ur 30 (A) NEGATIVE mg/dL   Nitrite POSITIVE (A) NEGATIVE   Leukocytes,Ua NEGATIVE NEGATIVE   RBC / HPF 0-5 0 - 5 RBC/hpf   WBC, UA 0-5 0 - 5 WBC/hpf   Bacteria, UA MANY (A) NONE SEEN    Squamous Epithelial / LPF 0-5 0 - 5   Mucus PRESENT    Ca Oxalate Crys, UA PRESENT     Comment: Performed at [facility], [address], [place], [place] [address]  CBC     Status: Abnormal   Collection Time: 11/11/20  6:36 PM  Result Value Ref Range   WBC 8.8 4.0 - 10.5 K/uL   RBC 3.87 3.87 - 5.11 MIL/uL   Hemoglobin 10.6 (L) 12.0 - 15.0 g/dL   HCT 47.8 (L) 29.5 - 62.1 %   MCV 85.8 80.0 - 100.0 fL   MCH 27.4 26.0 - 34.0 pg   MCHC 31.9 30.0 - 36.0 g/dL   RDW 30.8 65.7 - 84.6 %   Platelets 426 (H) 150 - 400 K/uL   nRBC 0.0 0.0 - 0.2 %    Comment: Performed at [facility], [address] [place], [place] [address]  Comprehensive metabolic panel     Status: Abnormal   Collection Time: 11/11/20  6:36 PM  Result Value Ref Range   Sodium 131 (L) 135 - 145 mmol/L   Potassium 3.4 (L) 3.5 - 5.1 mmol/L   Chloride 103 98 - 111 mmol/L   CO2 20 (L) 22 - 32 mmol/L   Glucose, Bld 108 (H) 70 - 99 mg/dL    Comment: Glucose reference range applies only to samples taken after fasting for at least 8 hours.   BUN 7 6 - 20 mg/dL   Creatinine, Ser 2.84 0.44 - 1.00 mg/dL   Calcium 9.0 8.9 - 13.2 mg/dL   Total Protein 6.2 (L) 6.5 - 8.1 g/dL   Albumin 2.5 (L) 3.5 - 5.0 g/dL   AST 18 15 - 41 U/L   ALT 19 0 - 44 U/L   Alkaline Phosphatase 84 38 - 126 U/L   Total Bilirubin 0.5 0.3 - 1.2 mg/dL   GFR, Estimated >44 >01 mL/min    Comment: (NOTE) Calculated using the CKD-EPI Creatinine Equation (2021)    Anion gap 8 5 - 15    Comment: Performed at [facility], [address], [place], [place] [address]   Review of Systems  Eyes: Negative for photophobia and visual disturbance.  Gastrointestinal: Negative for abdominal pain.  Neurological: Positive for headaches.   Physical Exam  Blood pressure (!) 146/81, pulse 99, temperature 98.1 F (36.7 C), temperature source Oral, resp. rate 18, last menstrual period 03/24/2020, SpO2 99 %.   Patient Vitals for the past 24 hrs:  BP  Temp Temp src Pulse Resp SpO2  11/11/20 2031 140/90 -- -- 89 -- --  11/11/20 2016 125/88 -- -- 100 -- --  11/11/20 2005 128/82 -- -- -- -- --  11/11/20 2001 135/88 -- -- 90 -- --  11/11/20 1946 129/86 -- -- 89 -- --  11/11/20 1916 (!) 131/91 -- -- 100 -- --  11/11/20 1901 (!) 144/92 -- -- 100 -- --  11/11/20 1840 (!) 138/92 -- -- 95 -- --  11/11/20 1825 -- -- -- -- -- 99 %  11/11/20 1816 (!) 146/81 -- -- 99 -- --  11/11/20 1815 -- -- -- -- -- 99 %  11/11/20 1805 138/89 -- -- (!) 103 -- 99 %  11/11/20 1737 (!) 135/96 98.1 F (36.7 C) Oral (!) 109 18 100 %   Physical Exam Constitutional:      General: She is not in acute distress.    Appearance: She is well-developed. She is not ill-appearing, toxic-appearing or diaphoretic.  HENT:     Head: Normocephalic.  Musculoskeletal:        General: Normal range of motion.     Right lower leg: No edema.     Left lower leg: No edema.  Neurological:     Mental Status: She is alert and oriented to person, place, and time.     GCS: GCS eye subscore is 4. GCS verbal subscore is 5. GCS motor subscore is 6.     Deep Tendon Reflexes: Reflexes normal.     Comments: Negative clonus   Psychiatric:        Behavior: Behavior normal.   Fetal Tracing: Baseline: 125 bpm Variability: Moderate  Accelerations: 15x15 Decelerations: None Toco: None  MAU Course  Procedures  None  MDM  Tylenol 1,000 mg PO  Headache now 0/10 Reviewed labs with patient, labs reassuring.   Assessment and Plan   A:  1. Gestational hypertension, third trimester   2. [redacted] weeks gestation of pregnancy   3. Pregnancy headache in third trimester     P:  Discharge home in stable condition Stop using home BP cuff. She is able to walk across the street and check BP at [facility]. She is unable to do an In-person visit on Monday d/t work and lives in [place]. She can do virtual visit and check bp at [facility] prior High priority message sent to [facility] for virtual BP visit on Monday.   Strict return precautions Preeclampsia precautions  Shubham Thackston, Harolyn Rutherford, NP 11/11/2020 8:59 PM

## 2020-11-16 ENCOUNTER — Other Ambulatory Visit: Payer: Medicaid Other

## 2020-11-16 ENCOUNTER — Encounter: Payer: Self-pay | Admitting: Family Medicine

## 2020-11-16 ENCOUNTER — Other Ambulatory Visit: Payer: Self-pay

## 2020-11-16 ENCOUNTER — Ambulatory Visit (INDEPENDENT_AMBULATORY_CARE_PROVIDER_SITE_OTHER): Payer: Medicaid Other | Admitting: Family Medicine

## 2020-11-16 VITALS — BP 127/88 | HR 98 | Wt 245.5 lb

## 2020-11-16 DIAGNOSIS — O133 Gestational [pregnancy-induced] hypertension without significant proteinuria, third trimester: Secondary | ICD-10-CM

## 2020-11-16 DIAGNOSIS — O3663X1 Maternal care for excessive fetal growth, third trimester, fetus 1: Secondary | ICD-10-CM

## 2020-11-16 DIAGNOSIS — R8281 Pyuria: Secondary | ICD-10-CM

## 2020-11-16 DIAGNOSIS — O0990 Supervision of high risk pregnancy, unspecified, unspecified trimester: Secondary | ICD-10-CM

## 2020-11-16 DIAGNOSIS — O09523 Supervision of elderly multigravida, third trimester: Secondary | ICD-10-CM

## 2020-11-16 NOTE — Patient Instructions (Signed)

## 2020-11-16 NOTE — Progress Notes (Signed)
   PRENATAL VISIT NOTE  Subjective:  Emily Davila is a 35 y.o. G2P1001 at [redacted]w[redacted]d being seen today for ongoing prenatal care.  She is currently monitored for the following issues for this high-risk pregnancy and has Supervision of high risk pregnancy, antepartum; AMA (advanced maternal age) multigravida 35+; Obesity during pregnancy; Mild intermittent asthma without complication; Anxiety; Abdominal wall hernia; Large for gestational age fetus affecting management of mother, antepartum, third trimester, fetus 1; and Gestational hypertension on their problem list.  Patient reports no complaints.  Contractions: Irritability. Vag. Bleeding: None.  Movement: Present. Denies leaking of fluid.   The following portions of the patient's history were reviewed and updated as appropriate: allergies, current medications, past family history, past medical history, past social history, past surgical history and problem list.   Objective:   Vitals:   11/16/20 1613 11/16/20 1644  BP: (!) 118/107 127/88  Pulse: (!) 113 98  Weight: 245 lb 8 oz (111.4 kg)     Fetal Status: Fetal Heart Rate (bpm): 148 Fundal Height: 36 cm Movement: Present  Presentation: Vertex  General:  Alert, oriented and cooperative. Patient is in no acute distress.  Skin: Skin is warm and dry. No rash noted.   Cardiovascular: Normal heart rate noted  Respiratory: Normal respiratory effort, no problems with respiration noted  Abdomen: Soft, gravid, appropriate for gestational age.  Pain/Pressure: Present     Pelvic: Cervical exam deferred        Extremities: Normal range of motion.  Edema: None  Mental Status: Normal mood and affect. Normal behavior. Normal judgment and thought content.   Assessment and Plan:  Pregnancy: G2P1001 at [redacted]w[redacted]d 1. Gestational hypertension, third trimester Has had elevated BPs, no severe range. Weekly BPPs beginning tomorrow Weekly labs--last on 5/20, WNL IOL scheduled for 37 + weeks.  2. Supervision  of high risk pregnancy, antepartum Will need GBS at next 1-2 visits  3. Multigravida of advanced maternal age in third trimester Low risk NIPT  4. Large for gestational age fetus affecting management of mother, antepartum, third trimester, fetus 1 Has f/u growth scheduled EFW at > 99%  5. Pyuria In MAU with + nitrates and many bacteria, will check culture. - Culture, OB Urine  Preterm labor symptoms and general obstetric precautions including but not limited to vaginal bleeding, contractions, leaking of fluid and fetal movement were reviewed in detail with the patient. Please refer to After Visit Summary for other counseling recommendations.   Return in 2 weeks (on 11/30/2020).  Future Appointments  Date Time Provider Department Center  11/17/2020  1:15 PM [facility] NST [facility]  12/01/2020  3:35 PM [facility]  12/12/2020  2:45 PM [facility] NURSE [facility]  12/12/2020  3:00 PM [facility] US1 [facility]    Reva Bores, MD

## 2020-11-17 ENCOUNTER — Ambulatory Visit: Payer: Medicaid Other | Admitting: *Deleted

## 2020-11-17 ENCOUNTER — Ambulatory Visit (INDEPENDENT_AMBULATORY_CARE_PROVIDER_SITE_OTHER): Payer: Medicaid Other

## 2020-11-17 ENCOUNTER — Other Ambulatory Visit: Payer: Self-pay | Admitting: Family Medicine

## 2020-11-17 VITALS — BP 137/87 | HR 102 | Wt 245.5 lb

## 2020-11-17 DIAGNOSIS — Z3A34 34 weeks gestation of pregnancy: Secondary | ICD-10-CM | POA: Diagnosis not present

## 2020-11-17 DIAGNOSIS — O133 Gestational [pregnancy-induced] hypertension without significant proteinuria, third trimester: Secondary | ICD-10-CM

## 2020-11-17 DIAGNOSIS — O131 Gestational [pregnancy-induced] hypertension without significant proteinuria, first trimester: Secondary | ICD-10-CM

## 2020-11-17 DIAGNOSIS — Z3A35 35 weeks gestation of pregnancy: Secondary | ICD-10-CM

## 2020-11-17 DIAGNOSIS — Z5941 Food insecurity: Secondary | ICD-10-CM

## 2020-11-17 NOTE — Progress Notes (Signed)
Pt informed that the ultrasound is considered a limited OB ultrasound and is not intended to be a complete ultrasound exam.  Patient also informed that the ultrasound is not being completed with the intent of assessing for fetal or placental anomalies or any pelvic abnormalities.  Explained that the purpose of today's ultrasound is to assess for presentation, BPP and amniotic fluid volume.  Patient acknowledges the purpose of the exam and the limitations of the study.    Pt escorted to [employer] per her request due to documented food insecurity.

## 2020-11-18 NOTE — Progress Notes (Signed)
NST:  Baseline: 135 bpm, Variability: Good {> 6 bpm), Accelerations: Reactive and Decelerations: Absent   

## 2020-11-19 LAB — CULTURE, OB URINE

## 2020-11-19 LAB — URINE CULTURE, OB REFLEX

## 2020-11-20 MED ORDER — NITROFURANTOIN MONOHYD MACRO 100 MG PO CAPS
100.0000 mg | ORAL_CAPSULE | Freq: Two times a day (BID) | ORAL | 2 refills | Status: DC
Start: 2020-11-20 — End: 2020-12-05

## 2020-11-20 NOTE — Addendum Note (Signed)
Addended by: Reva Bores on: 11/20/2020 07:52 AM   Modules accepted: Orders

## 2020-11-23 ENCOUNTER — Ambulatory Visit: Payer: Medicaid Other | Admitting: *Deleted

## 2020-11-23 ENCOUNTER — Ambulatory Visit (HOSPITAL_BASED_OUTPATIENT_CLINIC_OR_DEPARTMENT_OTHER): Payer: Medicaid Other

## 2020-11-23 ENCOUNTER — Other Ambulatory Visit: Payer: Self-pay

## 2020-11-23 ENCOUNTER — Ambulatory Visit: Payer: Medicaid Other | Attending: Obstetrics and Gynecology | Admitting: *Deleted

## 2020-11-23 DIAGNOSIS — E669 Obesity, unspecified: Secondary | ICD-10-CM

## 2020-11-23 DIAGNOSIS — O3663X0 Maternal care for excessive fetal growth, third trimester, not applicable or unspecified: Secondary | ICD-10-CM | POA: Diagnosis not present

## 2020-11-23 DIAGNOSIS — O99213 Obesity complicating pregnancy, third trimester: Secondary | ICD-10-CM

## 2020-11-23 DIAGNOSIS — Z3A35 35 weeks gestation of pregnancy: Secondary | ICD-10-CM

## 2020-11-23 DIAGNOSIS — O09523 Supervision of elderly multigravida, third trimester: Secondary | ICD-10-CM

## 2020-11-23 DIAGNOSIS — Z3A34 34 weeks gestation of pregnancy: Secondary | ICD-10-CM | POA: Diagnosis not present

## 2020-11-23 DIAGNOSIS — O131 Gestational [pregnancy-induced] hypertension without significant proteinuria, first trimester: Secondary | ICD-10-CM

## 2020-11-23 DIAGNOSIS — O0990 Supervision of high risk pregnancy, unspecified, unspecified trimester: Secondary | ICD-10-CM

## 2020-11-23 DIAGNOSIS — O133 Gestational [pregnancy-induced] hypertension without significant proteinuria, third trimester: Secondary | ICD-10-CM | POA: Diagnosis not present

## 2020-11-23 DIAGNOSIS — O99210 Obesity complicating pregnancy, unspecified trimester: Secondary | ICD-10-CM

## 2020-11-23 NOTE — Procedures (Signed)
Emily Davila 1986/05/08 [redacted]w[redacted]d  Fetus A Non-Stress Test Interpretation for 11/23/20  Indication: GHTN  Fetal Heart Rate A Mode: External Baseline Rate (A): 150 bpm Variability: Moderate Accelerations: 15 x 15 Decelerations: None Multiple birth?: No  Uterine Activity Mode: Palpation,Toco Contraction Frequency (min): none Resting Tone Palpated: Relaxed Resting Time: Adequate  Interpretation (Fetal Testing) Nonstress Test Interpretation: Reactive Overall Impression: Reassuring for gestational age Comments: Dr. Judeth Cornfield reviewed tracing

## 2020-11-28 ENCOUNTER — Other Ambulatory Visit (HOSPITAL_COMMUNITY)
Admission: RE | Admit: 2020-11-28 | Discharge: 2020-11-28 | Disposition: A | Discharge status: Home / Self Care | Payer: Medicaid Other | Source: Ambulatory Visit | Attending: Certified Nurse Midwife | Admitting: Certified Nurse Midwife

## 2020-11-28 ENCOUNTER — Encounter: Payer: Self-pay | Admitting: Obstetrics and Gynecology

## 2020-11-28 ENCOUNTER — Other Ambulatory Visit: Payer: Self-pay

## 2020-11-28 ENCOUNTER — Ambulatory Visit: Payer: Medicaid Other | Admitting: *Deleted

## 2020-11-28 ENCOUNTER — Ambulatory Visit (INDEPENDENT_AMBULATORY_CARE_PROVIDER_SITE_OTHER): Payer: Medicaid Other

## 2020-11-28 ENCOUNTER — Ambulatory Visit (INDEPENDENT_AMBULATORY_CARE_PROVIDER_SITE_OTHER): Payer: Medicaid Other | Admitting: Obstetrics and Gynecology

## 2020-11-28 VITALS — BP 124/90 | HR 107 | Wt 246.8 lb

## 2020-11-28 DIAGNOSIS — O133 Gestational [pregnancy-induced] hypertension without significant proteinuria, third trimester: Secondary | ICD-10-CM

## 2020-11-28 DIAGNOSIS — Z6841 Body Mass Index (BMI) 40.0 and over, adult: Secondary | ICD-10-CM | POA: Insufficient documentation

## 2020-11-28 DIAGNOSIS — Z3A35 35 weeks gestation of pregnancy: Secondary | ICD-10-CM

## 2020-11-28 DIAGNOSIS — O329XX0 Maternal care for malpresentation of fetus, unspecified, not applicable or unspecified: Secondary | ICD-10-CM

## 2020-11-28 DIAGNOSIS — O3663X1 Maternal care for excessive fetal growth, third trimester, fetus 1: Secondary | ICD-10-CM

## 2020-11-28 DIAGNOSIS — O09523 Supervision of elderly multigravida, third trimester: Secondary | ICD-10-CM

## 2020-11-28 DIAGNOSIS — O99210 Obesity complicating pregnancy, unspecified trimester: Secondary | ICD-10-CM

## 2020-11-28 DIAGNOSIS — O0990 Supervision of high risk pregnancy, unspecified, unspecified trimester: Secondary | ICD-10-CM | POA: Insufficient documentation

## 2020-11-28 DIAGNOSIS — Z5941 Food insecurity: Secondary | ICD-10-CM

## 2020-11-28 NOTE — Progress Notes (Signed)
Pt states she lost her mucous plug today. She has finished course of Macrobid and was unsure if she should obtain refill?  IOL scheduled on 6/19.  Pt to be escorted to [employer] today.

## 2020-11-28 NOTE — Progress Notes (Signed)
   PRENATAL VISIT NOTE  Subjective:  Emily Davila is a 35 y.o. G2P1001 at [redacted]w[redacted]d being seen today for ongoing prenatal care.  She is currently monitored for the following issues for this high-risk pregnancy and has Supervision of high risk pregnancy, antepartum; AMA (advanced maternal age) multigravida 35+; Obesity during pregnancy; Mild intermittent asthma without complication; Anxiety; Abdominal wall hernia; Large for gestational age fetus affecting management of mother, antepartum, third trimester, fetus 1; Gestational hypertension; and BMI 40.0-44.9, adult (HCC) on their problem list.  Patient reports no complaints.  Contractions: Irregular. Vag. Bleeding: None.  Movement: Present. Denies leaking of fluid.   The following portions of the patient's history were reviewed and updated as appropriate: allergies, current medications, past family history, past medical history, past social history, past surgical history and problem list.   Objective:   Vitals:   11/28/20 0908  BP: 124/90  Pulse: (!) 107  Weight: 246 lb 12.8 oz (111.9 kg)    Fetal Status: Fetal Heart Rate (bpm): RNST   Movement: Present  Presentation: Vertex  General:  Alert, oriented and cooperative. Patient is in no acute distress.  Skin: Skin is warm and dry. No rash noted.   Cardiovascular: Normal heart rate noted  Respiratory: Normal respiratory effort, no problems with respiration noted  Abdomen: Soft, gravid, appropriate for gestational age.  Pain/Pressure: Absent     Pelvic: Cervical exam performed in the presence of a chaperone Dilation: Fingertip Effacement (%): 50 Station: Ballotable  Extremities: Normal range of motion.  Edema: None  Mental Status: Normal mood and affect. Normal behavior. Normal judgment and thought content.   Assessment and Plan:  Pregnancy: G2P1001 at [redacted]w[redacted]d 1. Supervision of high risk pregnancy, antepartum Set up for IOL on 6/19 already GBS today - GC/Chlamydia probe amp ([place])not  at [facility] - Strep Gp B Culture+Rflx - CBC - Comprehensive metabolic panel  2. Gestational hypertension, third trimester Doing well on no meds. Routine surveillance labs today. Has rpt bpp on 6/13. BPP today 10/10 today, cephalic - CBC - Comprehensive metabolic panel  3. Multigravida of advanced maternal age in third trimester No issues  4. Food insecurity - AMBULATORY REFERRAL TO [facility]  5. [redacted] weeks gestation of pregnancy  6. Large for gestational age fetus affecting management of mother, antepartum, third trimester, fetus 1 Normal 2h GTT screening already 2867gm, >99%, ac >99% on 5/16.  Largest prior was 4281 @ 40wks   7. Obesity during pregnancy  8. BMI 40.0-44.9, adult (HCC)   Preterm labor symptoms and general obstetric precautions including but not limited to vaginal bleeding, contractions, leaking of fluid and fetal movement were reviewed in detail with the patient. Please refer to After Visit Summary for other counseling recommendations.   Return in about 1 week (around 12/05/2020) for as scheduled.  Future Appointments  Date Time Provider Department Center  11/28/2020  9:50 AM [facility] US1 [facility]  12/05/2020  2:15 PM Warden Fillers, MD [facility]  12/05/2020  3:15 PM [facility] NST [facility]  12/11/2020 12:00 AM [facility] SCHED ROOM [facility] None    Selma Bing, MD

## 2020-11-29 LAB — CBC
Hematocrit: 31.9 % — ABNORMAL LOW (ref 34.0–46.6)
Hemoglobin: 10.4 g/dL — ABNORMAL LOW (ref 11.1–15.9)
MCH: 27 pg (ref 26.6–33.0)
MCHC: 32.6 g/dL (ref 31.5–35.7)
MCV: 83 fL (ref 79–97)
Platelets: 424 10*3/uL (ref 150–450)
RBC: 3.85 x10E6/uL (ref 3.77–5.28)
RDW: 13.9 % (ref 11.7–15.4)
WBC: 9.2 10*3/uL (ref 3.4–10.8)

## 2020-11-29 LAB — COMPREHENSIVE METABOLIC PANEL
ALT: 18 IU/L (ref 0–32)
AST: 12 IU/L (ref 0–40)
Albumin/Globulin Ratio: 1.2 (ref 1.2–2.2)
Albumin: 3.4 g/dL — ABNORMAL LOW (ref 3.8–4.8)
Alkaline Phosphatase: 112 IU/L (ref 44–121)
BUN/Creatinine Ratio: 15 (ref 9–23)
BUN: 8 mg/dL (ref 6–20)
Bilirubin Total: 0.4 mg/dL (ref 0.0–1.2)
CO2: 19 mmol/L — ABNORMAL LOW (ref 20–29)
Calcium: 9.2 mg/dL (ref 8.7–10.2)
Chloride: 104 mmol/L (ref 96–106)
Creatinine, Ser: 0.52 mg/dL — ABNORMAL LOW (ref 0.57–1.00)
Globulin, Total: 2.8 g/dL (ref 1.5–4.5)
Glucose: 119 mg/dL — ABNORMAL HIGH (ref 65–99)
Potassium: 4.2 mmol/L (ref 3.5–5.2)
Sodium: 135 mmol/L (ref 134–144)
Total Protein: 6.2 g/dL (ref 6.0–8.5)
eGFR: 123 mL/min/{1.73_m2} (ref >59)

## 2020-11-29 LAB — GC/CHLAMYDIA PROBE AMP (~~LOC~~) NOT AT ARMC
Chlamydia: NEGATIVE
Comment: NEGATIVE
Comment: NORMAL
Neisseria Gonorrhea: NEGATIVE

## 2020-12-01 ENCOUNTER — Encounter: Payer: Medicaid Other | Admitting: Certified Nurse Midwife

## 2020-12-02 ENCOUNTER — Telehealth (HOSPITAL_COMMUNITY): Payer: Self-pay | Admitting: *Deleted

## 2020-12-02 ENCOUNTER — Other Ambulatory Visit: Payer: Self-pay | Admitting: Advanced Practice Midwife

## 2020-12-02 LAB — STREP GP B CULTURE+RFLX: Strep Gp B Culture+Rflx: NEGATIVE

## 2020-12-02 NOTE — Telephone Encounter (Signed)
Preadmission screen  

## 2020-12-05 ENCOUNTER — Ambulatory Visit (INDEPENDENT_AMBULATORY_CARE_PROVIDER_SITE_OTHER): Payer: Medicaid Other

## 2020-12-05 ENCOUNTER — Other Ambulatory Visit: Payer: Self-pay

## 2020-12-05 ENCOUNTER — Ambulatory Visit: Payer: Medicaid Other | Admitting: *Deleted

## 2020-12-05 ENCOUNTER — Telehealth (HOSPITAL_COMMUNITY): Payer: Self-pay | Admitting: *Deleted

## 2020-12-05 ENCOUNTER — Ambulatory Visit (INDEPENDENT_AMBULATORY_CARE_PROVIDER_SITE_OTHER): Payer: Medicaid Other | Admitting: Obstetrics and Gynecology

## 2020-12-05 ENCOUNTER — Other Ambulatory Visit: Payer: Self-pay | Admitting: Obstetrics and Gynecology

## 2020-12-05 VITALS — BP 139/104 | HR 99 | Wt 248.9 lb

## 2020-12-05 VITALS — BP 137/95 | HR 95

## 2020-12-05 DIAGNOSIS — O133 Gestational [pregnancy-induced] hypertension without significant proteinuria, third trimester: Secondary | ICD-10-CM

## 2020-12-05 DIAGNOSIS — K439 Ventral hernia without obstruction or gangrene: Secondary | ICD-10-CM

## 2020-12-05 DIAGNOSIS — O09523 Supervision of elderly multigravida, third trimester: Secondary | ICD-10-CM

## 2020-12-05 DIAGNOSIS — O99210 Obesity complicating pregnancy, unspecified trimester: Secondary | ICD-10-CM

## 2020-12-05 DIAGNOSIS — O0990 Supervision of high risk pregnancy, unspecified, unspecified trimester: Secondary | ICD-10-CM

## 2020-12-05 DIAGNOSIS — O3663X1 Maternal care for excessive fetal growth, third trimester, fetus 1: Secondary | ICD-10-CM

## 2020-12-05 DIAGNOSIS — J4520 Mild intermittent asthma, uncomplicated: Secondary | ICD-10-CM

## 2020-12-05 DIAGNOSIS — Z3A36 36 weeks gestation of pregnancy: Secondary | ICD-10-CM | POA: Insufficient documentation

## 2020-12-05 DIAGNOSIS — Z6841 Body Mass Index (BMI) 40.0 and over, adult: Secondary | ICD-10-CM

## 2020-12-05 NOTE — Progress Notes (Signed)
   PRENATAL VISIT NOTE  Subjective:  Emily Davila is a 35 y.o. G2P1001 at [redacted]w[redacted]d being seen today for ongoing prenatal care.  She is currently monitored for the following issues for this high-risk pregnancy and has Supervision of high risk pregnancy, antepartum; AMA (advanced maternal age) multigravida 35+; Obesity during pregnancy; Mild intermittent asthma without complication; Anxiety; Abdominal wall hernia; Large for gestational age fetus affecting management of mother, antepartum, third trimester, fetus 1; Gestational hypertension; BMI 40.0-44.9, adult (HCC); and [redacted] weeks gestation of pregnancy on their problem list.  Patient doing well with no acute concerns today. She reports no complaints.  Contractions: Irritability. Vag. Bleeding: None.  Movement: Present. Denies leaking of fluid.   Pt is scheduled for IOL on 6/19  The following portions of the patient's history were reviewed and updated as appropriate: allergies, current medications, past family history, past medical history, past social history, past surgical history and problem list. Problem list updated.  Objective:   Vitals:   12/05/20 1425 12/05/20 1506  BP: (!) 142/99 (!) 139/104  Pulse: (!) 114 99  Weight: 248 lb 14.4 oz (112.9 kg)     Fetal Status: Fetal Heart Rate (bpm): 130 Fundal Height: 39 cm Movement: Present     General:  Alert, oriented and cooperative. Patient is in no acute distress.  Skin: Skin is warm and dry. No rash noted.   Cardiovascular: Normal heart rate noted  Respiratory: Normal respiratory effort, no problems with respiration noted  Abdomen: Soft, gravid, appropriate for gestational age.  Pain/Pressure: Present     Pelvic: Cervical exam deferred        Extremities: Normal range of motion.  Edema: None  Mental Status:  Normal mood and affect. Normal behavior. Normal judgment and thought content.   Assessment and Plan:  Pregnancy: G2P1001 at [redacted]w[redacted]d  1. Gestational hypertension, third  trimester Pt has NST/BPP today,will recheck BP again at that time.  IF DBP > 100, pt to MAU, IOL 6/19  2. Mild intermittent asthma without complication   3. Supervision of high risk pregnancy, antepartum See above  4. Multigravida of advanced maternal age in third trimester   5. Obesity during pregnancy   6. Abdominal wall hernia   7. Large for gestational age fetus affecting management of mother, antepartum, third trimester, fetus 1 EFW by exam, 8 pounds 8 ounce, could be larger  8. BMI 40.0-44.9, adult (HCC)   9. [redacted] weeks gestation of pregnancy   Term labor symptoms and general obstetric precautions including but not limited to vaginal bleeding, contractions, leaking of fluid and fetal movement were reviewed in detail with the patient.  Please refer to After Visit Summary for other counseling recommendations.  IOL scheduled No follow-ups on file.   Mariel Aloe, MD Faculty Attending Center for [facility]

## 2020-12-05 NOTE — Telephone Encounter (Signed)
Preadmission screen  

## 2020-12-06 ENCOUNTER — Telehealth (HOSPITAL_COMMUNITY): Payer: Self-pay | Admitting: *Deleted

## 2020-12-06 ENCOUNTER — Encounter (HOSPITAL_COMMUNITY): Payer: Self-pay | Admitting: *Deleted

## 2020-12-06 NOTE — Telephone Encounter (Signed)
Preadmission screen  

## 2020-12-09 ENCOUNTER — Other Ambulatory Visit (HOSPITAL_COMMUNITY)
Admission: RE | Admit: 2020-12-09 | Discharge: 2020-12-09 | Disposition: A | Discharge status: Home / Self Care | Payer: Medicaid Other | Source: Ambulatory Visit | Attending: Obstetrics and Gynecology | Admitting: Obstetrics and Gynecology

## 2020-12-09 DIAGNOSIS — Z20822 Contact with and (suspected) exposure to covid-19: Secondary | ICD-10-CM | POA: Diagnosis not present

## 2020-12-09 DIAGNOSIS — Z01812 Encounter for preprocedural laboratory examination: Secondary | ICD-10-CM | POA: Diagnosis not present

## 2020-12-09 LAB — SARS CORONAVIRUS 2 (TAT 6-24 HRS): SARS Coronavirus 2: NEGATIVE

## 2020-12-11 ENCOUNTER — Inpatient Hospital Stay (HOSPITAL_COMMUNITY): Payer: Medicaid Other

## 2020-12-11 ENCOUNTER — Other Ambulatory Visit: Payer: Self-pay

## 2020-12-11 ENCOUNTER — Encounter (HOSPITAL_COMMUNITY): Payer: Self-pay | Admitting: Family Medicine

## 2020-12-11 ENCOUNTER — Inpatient Hospital Stay (HOSPITAL_COMMUNITY): Payer: Medicaid Other | Admitting: Anesthesiology

## 2020-12-11 ENCOUNTER — Inpatient Hospital Stay (HOSPITAL_COMMUNITY)
Admission: AD | Admit: 2020-12-11 | Discharge: 2020-12-14 | DRG: 805 | Disposition: A | Discharge status: Home / Self Care | Payer: Medicaid Other | Attending: Family Medicine | Admitting: Family Medicine

## 2020-12-11 DIAGNOSIS — Z87891 Personal history of nicotine dependence: Secondary | ICD-10-CM

## 2020-12-11 DIAGNOSIS — O09529 Supervision of elderly multigravida, unspecified trimester: Secondary | ICD-10-CM

## 2020-12-11 DIAGNOSIS — O3663X0 Maternal care for excessive fetal growth, third trimester, not applicable or unspecified: Secondary | ICD-10-CM | POA: Diagnosis present

## 2020-12-11 DIAGNOSIS — O134 Gestational [pregnancy-induced] hypertension without significant proteinuria, complicating childbirth: Secondary | ICD-10-CM | POA: Diagnosis present

## 2020-12-11 DIAGNOSIS — O9962 Diseases of the digestive system complicating childbirth: Secondary | ICD-10-CM | POA: Diagnosis present

## 2020-12-11 DIAGNOSIS — O99019 Anemia complicating pregnancy, unspecified trimester: Secondary | ICD-10-CM

## 2020-12-11 DIAGNOSIS — O9952 Diseases of the respiratory system complicating childbirth: Secondary | ICD-10-CM | POA: Diagnosis present

## 2020-12-11 DIAGNOSIS — O99214 Obesity complicating childbirth: Secondary | ICD-10-CM | POA: Diagnosis present

## 2020-12-11 DIAGNOSIS — O99210 Obesity complicating pregnancy, unspecified trimester: Secondary | ICD-10-CM | POA: Diagnosis present

## 2020-12-11 DIAGNOSIS — K439 Ventral hernia without obstruction or gangrene: Secondary | ICD-10-CM | POA: Diagnosis present

## 2020-12-11 DIAGNOSIS — O411230 Chorioamnionitis, third trimester, not applicable or unspecified: Secondary | ICD-10-CM | POA: Diagnosis present

## 2020-12-11 DIAGNOSIS — F419 Anxiety disorder, unspecified: Secondary | ICD-10-CM | POA: Diagnosis present

## 2020-12-11 DIAGNOSIS — Z6841 Body Mass Index (BMI) 40.0 and over, adult: Secondary | ICD-10-CM

## 2020-12-11 DIAGNOSIS — Z3A37 37 weeks gestation of pregnancy: Secondary | ICD-10-CM | POA: Diagnosis not present

## 2020-12-11 DIAGNOSIS — J4520 Mild intermittent asthma, uncomplicated: Secondary | ICD-10-CM | POA: Diagnosis present

## 2020-12-11 DIAGNOSIS — O9081 Anemia of the puerperium: Secondary | ICD-10-CM | POA: Diagnosis not present

## 2020-12-11 DIAGNOSIS — D509 Iron deficiency anemia, unspecified: Secondary | ICD-10-CM

## 2020-12-11 DIAGNOSIS — O139 Gestational [pregnancy-induced] hypertension without significant proteinuria, unspecified trimester: Secondary | ICD-10-CM | POA: Diagnosis present

## 2020-12-11 LAB — COMPREHENSIVE METABOLIC PANEL
ALT: 15 U/L (ref 0–44)
AST: 16 U/L (ref 15–41)
Albumin: 2.6 g/dL — ABNORMAL LOW (ref 3.5–5.0)
Alkaline Phosphatase: 94 U/L (ref 38–126)
Anion gap: 11 (ref 5–15)
BUN: 9 mg/dL (ref 6–20)
CO2: 19 mmol/L — ABNORMAL LOW (ref 22–32)
Calcium: 9.1 mg/dL (ref 8.9–10.3)
Chloride: 105 mmol/L (ref 98–111)
Creatinine, Ser: 0.51 mg/dL (ref 0.44–1.00)
GFR, Estimated: >60 mL/min (ref >60)
Glucose, Bld: 105 mg/dL — ABNORMAL HIGH (ref 70–99)
Potassium: 3.8 mmol/L (ref 3.5–5.1)
Sodium: 135 mmol/L (ref 135–145)
Total Bilirubin: 0.7 mg/dL (ref 0.3–1.2)
Total Protein: 6.4 g/dL — ABNORMAL LOW (ref 6.5–8.1)

## 2020-12-11 LAB — TYPE AND SCREEN
ABO/RH(D): A POS
Antibody Screen: NEGATIVE

## 2020-12-11 LAB — CBC
HCT: 33 % — ABNORMAL LOW (ref 36.0–46.0)
Hemoglobin: 10.7 g/dL — ABNORMAL LOW (ref 12.0–15.0)
MCH: 26.8 pg (ref 26.0–34.0)
MCHC: 32.4 g/dL (ref 30.0–36.0)
MCV: 82.7 fL (ref 80.0–100.0)
Platelets: 451 10*3/uL — ABNORMAL HIGH (ref 150–400)
RBC: 3.99 MIL/uL (ref 3.87–5.11)
RDW: 15.6 % — ABNORMAL HIGH (ref 11.5–15.5)
WBC: 10 10*3/uL (ref 4.0–10.5)
nRBC: 0.2 % (ref 0.0–0.2)

## 2020-12-11 LAB — PROTEIN / CREATININE RATIO, URINE
Creatinine, Urine: 175.74 mg/dL
Protein Creatinine Ratio: 0.17 mg/mg{Cre} — ABNORMAL HIGH (ref 0.00–0.15)
Total Protein, Urine: 30 mg/dL

## 2020-12-11 LAB — RPR: RPR Ser Ql: NONREACTIVE

## 2020-12-11 MED ORDER — LACTATED RINGERS IV SOLN: INTRAVENOUS | Status: DC

## 2020-12-11 MED ORDER — ACETAMINOPHEN 325 MG PO TABS
650.0000 mg | ORAL_TABLET | ORAL | Status: DC | PRN
  Administered 2020-12-11: 650 mg via ORAL
  Filled 2020-12-11 (×2): qty 2

## 2020-12-11 MED ORDER — ONDANSETRON HCL 4 MG/2ML IJ SOLN
4.0000 mg | Freq: Four times a day (QID) | INTRAMUSCULAR | Status: DC | PRN
  Administered 2020-12-11 (×2): 4 mg via INTRAVENOUS
  Filled 2020-12-11 (×2): qty 2

## 2020-12-11 MED ORDER — TERBUTALINE SULFATE 1 MG/ML IJ SOLN: 0.2500 mg | Freq: Once | INTRAMUSCULAR | Status: DC | PRN

## 2020-12-11 MED ORDER — MISOPROSTOL 25 MCG QUARTER TABLET
25.0000 ug | ORAL_TABLET | ORAL | Status: DC | PRN
  Administered 2020-12-11: 25 ug via VAGINAL
  Filled 2020-12-11: qty 1

## 2020-12-11 MED ORDER — LIDOCAINE HCL (PF) 1 % IJ SOLN
INTRAMUSCULAR | Status: DC | PRN
  Administered 2020-12-11: 10 mL via EPIDURAL
  Administered 2020-12-11: 2 mL via EPIDURAL

## 2020-12-11 MED ORDER — OXYCODONE-ACETAMINOPHEN 5-325 MG PO TABS: 1.0000 | ORAL_TABLET | ORAL | Status: DC | PRN

## 2020-12-11 MED ORDER — FENTANYL CITRATE (PF) 100 MCG/2ML IJ SOLN
50.0000 ug | INTRAMUSCULAR | Status: DC | PRN
  Administered 2020-12-11: 100 ug via INTRAVENOUS
  Filled 2020-12-11: qty 2

## 2020-12-11 MED ORDER — PHENYLEPHRINE 40 MCG/ML (10ML) SYRINGE FOR IV PUSH (FOR BLOOD PRESSURE SUPPORT)
80.0000 ug | PREFILLED_SYRINGE | INTRAVENOUS | Status: DC | PRN
  Filled 2020-12-11: qty 10

## 2020-12-11 MED ORDER — OXYTOCIN BOLUS FROM INFUSION
333.0000 mL | Freq: Once | INTRAVENOUS | Status: AC
  Administered 2020-12-12: 333 mL via INTRAVENOUS

## 2020-12-11 MED ORDER — LACTATED RINGERS IV SOLN
500.0000 mL | INTRAVENOUS | Status: DC | PRN
  Administered 2020-12-11: 500 mL via INTRAVENOUS

## 2020-12-11 MED ORDER — EPHEDRINE 5 MG/ML INJ: 10.0000 mg | INTRAVENOUS | Status: DC | PRN

## 2020-12-11 MED ORDER — FLEET ENEMA 7-19 GM/118ML RE ENEM: 1.0000 | ENEMA | RECTAL | Status: DC | PRN

## 2020-12-11 MED ORDER — FENTANYL-BUPIVACAINE-NACL 0.5-0.125-0.9 MG/250ML-% EP SOLN
12.0000 mL/h | EPIDURAL | Status: DC | PRN
  Administered 2020-12-12: 12 mL/h via EPIDURAL
  Filled 2020-12-11: qty 250

## 2020-12-11 MED ORDER — OXYTOCIN-SODIUM CHLORIDE 30-0.9 UT/500ML-% IV SOLN
1.0000 m[IU]/min | INTRAVENOUS | Status: DC
  Administered 2020-12-11 – 2020-12-12 (×3): 2 m[IU]/min via INTRAVENOUS
  Administered 2020-12-12: 1 m[IU]/min via INTRAVENOUS

## 2020-12-11 MED ORDER — LIDOCAINE HCL (PF) 1 % IJ SOLN: 30.0000 mL | INTRAMUSCULAR | Status: DC | PRN

## 2020-12-11 MED ORDER — SOD CITRATE-CITRIC ACID 500-334 MG/5ML PO SOLN: 30.0000 mL | ORAL | Status: DC | PRN

## 2020-12-11 MED ORDER — FENTANYL-BUPIVACAINE-NACL 0.5-0.125-0.9 MG/250ML-% EP SOLN
EPIDURAL | Status: DC | PRN
  Administered 2020-12-11: 12 mL/h via EPIDURAL

## 2020-12-11 MED ORDER — LACTATED RINGERS IV SOLN
500.0000 mL | Freq: Once | INTRAVENOUS | Status: AC
  Administered 2020-12-11: 500 mL via INTRAVENOUS

## 2020-12-11 MED ORDER — DIPHENHYDRAMINE HCL 50 MG/ML IJ SOLN: 12.5000 mg | INTRAMUSCULAR | Status: DC | PRN

## 2020-12-11 MED ORDER — OXYTOCIN-SODIUM CHLORIDE 30-0.9 UT/500ML-% IV SOLN
2.5000 [IU]/h | INTRAVENOUS | Status: DC
  Administered 2020-12-12: 2.5 [IU]/h via INTRAVENOUS
  Filled 2020-12-11 (×2): qty 500

## 2020-12-11 MED ORDER — PHENYLEPHRINE 40 MCG/ML (10ML) SYRINGE FOR IV PUSH (FOR BLOOD PRESSURE SUPPORT): 80.0000 ug | PREFILLED_SYRINGE | INTRAVENOUS | Status: DC | PRN

## 2020-12-11 MED ORDER — FENTANYL-BUPIVACAINE-NACL 0.5-0.125-0.9 MG/250ML-% EP SOLN
EPIDURAL | Status: AC
  Filled 2020-12-11: qty 250

## 2020-12-11 MED ORDER — SODIUM CHLORIDE 0.9 % IV SOLN
3.0000 g | Freq: Four times a day (QID) | INTRAVENOUS | Status: DC
  Administered 2020-12-12 (×3): 3 g via INTRAVENOUS
  Filled 2020-12-11: qty 3
  Filled 2020-12-11 (×3): qty 8
  Filled 2020-12-11: qty 3
  Filled 2020-12-11: qty 8

## 2020-12-11 MED ORDER — OXYCODONE-ACETAMINOPHEN 5-325 MG PO TABS: 2.0000 | ORAL_TABLET | ORAL | Status: DC | PRN

## 2020-12-11 NOTE — Anesthesia Preprocedure Evaluation (Signed)
Anesthesia Evaluation  Patient identified by MRN, date of birth, ID band Patient awake    Reviewed: Allergy & Precautions, Patient's Chart, lab work & pertinent test results  Airway Mallampati: III  TM Distance: >3 FB Neck ROM: Full    Dental no notable dental hx.    Pulmonary asthma , former smoker,  Quit smoking 2020   Pulmonary exam normal breath sounds clear to auscultation       Cardiovascular hypertension, negative cardio ROS Normal cardiovascular exam Rhythm:Regular Rate:Normal     Neuro/Psych  Headaches, PSYCHIATRIC DISORDERS Anxiety    GI/Hepatic negative GI ROS, Neg liver ROS,   Endo/Other  Morbid obesityBMI 40  Renal/GU negative Renal ROS  negative genitourinary   Musculoskeletal negative musculoskeletal ROS (+)   Abdominal (+) + obese,   Peds negative pediatric ROS (+)  Hematology  (+) Blood dyscrasia, anemia , hct 33, plt 451   Anesthesia Other Findings   Reproductive/Obstetrics (+) Pregnancy                             Anesthesia Physical Anesthesia Plan  ASA: 3 and emergent  Anesthesia Plan: Epidural   Post-op Pain Management:    Induction:   PONV Risk Score and Plan: 2  Airway Management Planned: Natural Airway  Additional Equipment: None  Intra-op Plan:   Post-operative Plan:   Informed Consent: I have reviewed the patients History and Physical, chart, labs and discussed the procedure including the risks, benefits and alternatives for the proposed anesthesia with the patient or authorized representative who has indicated his/her understanding and acceptance.       Plan Discussed with:   Anesthesia Plan Comments:         Anesthesia Quick Evaluation

## 2020-12-11 NOTE — Progress Notes (Signed)
Labor Progress Note Emily Davila is a 35 y.o. G2P1001 at [redacted]w[redacted]d presented for IOL for gHTN  S:  Comfortable with epidural. No c/o.  O:  BP 128/78   Pulse 69   Temp 97.8 F (36.6 C) (Oral)   Resp 18   LMP 03/24/2020  EFM: baseline 135 bpm/ mod variability/ + accels/ no decels  Toco/IUPC: 2-4 SVE: Dilation: 4 Effacement (%): 60 Cervical Position: Posterior Station: -2 Presentation: Vertex Exam by:: Shakendra Griffeth B. CNM Pitocin: 14 mu/min  A/P: 35 y.o. G2P1001 [redacted]w[redacted]d  1. Labor: latent 2. FWB: Cat I 3. Pain: epidural 4. gHTN: stable  Continue Pitocin. Consider AROM. Anticipate labor progress and SVD.  Donette Larry, CNM 6:12 PM

## 2020-12-11 NOTE — Progress Notes (Signed)
Labor Progress Note Jaelen Gellerman is a 35 y.o. G2P1001 at [redacted]w[redacted]d presented for IOL for gHTN  S:  Comfortable with epidural. Took a nap   O:  BP (!) 144/64   Pulse (!) 108   Temp 97.8 F (36.6 C) (Oral)   Resp 18   LMP 03/24/2020  EFM: baseline 135 bpm/ mod variability/ + accels/ no decels  Toco: q2-3   SVE: Dilation: 4 Effacement (%): 60 Cervical Position: Posterior Station: -2 Presentation: Vertex Exam by:: Melanie B. CNM Pitocin: 14 mu/min  A/P: 35 y.o. G2P1001 [redacted]w[redacted]d  1. Labor: s/p cytotec x1. Pit started at 0800. AROM at 2030 for clear fluid. Continue to titrate pit, anticipate SVD. Baby suspected LGA. Shoulder dystocia precautions at delivery.   2. FWB: Cat I 3. Pain: epidural 4. gHTN: stable    Gita Kudo, MD 9:10 PM

## 2020-12-11 NOTE — Progress Notes (Addendum)
Labor Progress Note Emily Territo is a 35 y.o. G2P1001 at [redacted]w[redacted]d presented for IOL for gHTN S:  Feeling okay with epidural, denies fever and chills.  O:  BP 91/79   Pulse (!) 129   Temp (!) 100.8 F (38.2 C) (Oral)   Resp 16   LMP 03/24/2020   SpO2 99%  EFM: baseline 175/moderate variability/+accels/variable decels Toco: contractions every 1-4 min  CVE: Dilation: 5.5 Effacement (%): 90 Cervical Position: Posterior Station: -2 Presentation: Vertex Exam by:: Dr. Myriam Jacobson   A&P: 35 y.o. G2P1001 [redacted]w[redacted]d for IOL for gHTN #IOL: Progressing well. S/p Cytotec x1. On Pitocin since 0800, currently at 18 mu/min. AROM at 2030 with clear fluid. IUPC just placed. Baby suspected LGA. Shoulder dystocia precautions at delivery. #Triple I: now with new maternal fever to 100.65F as of 2341 along with maternal tachycardia and fetal tachycardia. Given Tylenol at 2233. Will start ampicillin-sulbactam. #Pain: epidural #FWB: cat II, starting amnioinfusion #GBS negative #gHTN: stable, intermittent mild range pressures  Littie Deeds, MD 11:45 PM   I saw and evaluated the patient. I agree with the findings and the plan of care as documented in the resident's note.  Casper Harrison, MD [facility] Family Medicine Fellow, [facility] for [facility], [facility]

## 2020-12-11 NOTE — H&P (Addendum)
OBSTETRIC ADMISSION HISTORY AND PHYSICAL  Emily Davila is a 35 y.o. female G2P1001 with IUP at [redacted]w[redacted]d by LMP presenting for IOL for gHTN. She reports +FMs, No LOF, no VB, no blurry vision, headaches or peripheral edema, and RUQ pain.  She plans on breast and bottle feeding. She request BTL for birth control, inpatient if possible though has not completed paperwork. She received her prenatal care at [facility]  Dating: By LMP --->  Estimated Date of Delivery: 12/29/20  Sono:    @[redacted]w[redacted]d , CWD, normal anatomy, breech presentation, breech lie, 2867g, >99% EFW  (Cephalic presentation on most recent BPP at [redacted]w[redacted]d)  Prenatal History/Complications:  gHTN AMA Obesity (pregravid BMI 38) Macrosomia  Past Medical History: Past Medical History:  Diagnosis Date   Anxiety 09/02/2015   Asthma    BV (bacterial vaginosis)    E. coli UTI    H/O seasonal allergies    Headache(784.0)    PID (acute pelvic inflammatory disease)    Pregnancy induced hypertension     Past Surgical History: Past Surgical History:  Procedure Laterality Date   CHOLECYSTECTOMY  04/29/2011   Procedure: LAPAROSCOPIC CHOLECYSTECTOMY WITH INTRAOPERATIVE CHOLANGIOGRAM;  Surgeon: 13/09/2010. Wilmon Arms, MD;  Location: [facility] OR;  Service: General;  Laterality: N/A;  latex allergy, inpatient 5127   FOOT SURGERY      Obstetrical History: OB History     Gravida  2   Para  1   Term  1   Preterm  0   AB  0   Living  1      SAB  0   IAB  0   Ectopic  0   Multiple  0   Live Births  1           Social History Social History   Socioeconomic History   Marital status: Single    Spouse name: Not on file   Number of children: Not on file   Years of education: Not on file   Highest education level: Not on file  Occupational History   Not on file  Tobacco Use   Smoking status: Former    Pack years: 0.00    Types: Cigarettes    Quit date: 08/2018    Years since quitting: 2.3   Smokeless tobacco: Never  Vaping  Use   Vaping Use: Never used  Substance and Sexual Activity   Alcohol use: No   Drug use: Not Currently    Types: Marijuana    Comment: LAST SMOKED - NONE WHILE PREG   Sexual activity: Yes    Birth control/protection: None  Other Topics Concern   Not on file  Social History Narrative   Not on file   Social Determinants of Health   Financial Resource Strain: Not on file  Food Insecurity: Food Insecurity Present   Worried About Running Out of Food in the Last Year: Sometimes true   Ran Out of Food in the Last Year: Sometimes true  Transportation Needs: No Transportation Needs   Lack of Transportation (Medical): No   Lack of Transportation (Non-Medical): No  Physical Activity: Not on file  Stress: Not on file  Social Connections: Not on file    Family History: Family History  Adopted: Yes  Problem Relation Age of Onset   Graves' disease Mother    Heart murmur Mother    ALS Maternal Grandfather     Allergies: Allergies  Allergen Reactions   Latex Itching   Keflex [Cephalexin] Itching  Medications Prior to Admission  Medication Sig Dispense Refill Last Dose   albuterol (VENTOLIN HFA) 108 (90 Base) MCG/ACT inhaler Inhale 1 puff into the lungs every 4 (four) hours as needed for wheezing or shortness of breath. 1 each 1 Past Month   loratadine (CLARITIN) 10 MG tablet Take 10 mg by mouth daily.   Past Week   Prenatal Vit-Fe Fumarate-FA (PRENATAL VITAMIN PO) Take 1 tablet by mouth daily.   12/10/2020   acetaminophen (TYLENOL) 500 MG tablet Take 1,000 mg by mouth every 6 (six) hours as needed.   More than a month     Review of Systems   All systems reviewed and negative except as stated in HPI  Blood pressure (!) 147/97, pulse 95, temperature 98.4 F (36.9 C), temperature source Oral, resp. rate 18, last menstrual period 03/24/2020. General appearance: alert, cooperative, appears stated age, and no distress Lungs: clear to auscultation bilaterally Heart: regular  rate Abdomen: gravid Presentation: cephalic Fetal monitoringBaseline: 135 bpm, Variability: Good {> 6 bpm), Accelerations: Reactive, and Decelerations: Absent Uterine activityFrequency: intermittent Dilation: 1 Effacement (%): 50 Station: Ballotable Exam by:: Elwyn Reach, RN   Prenatal labs: ABO, Rh: A/Positive/-- (12/22 1622) Antibody: Negative (12/22 1622) Rubella: 2.42 (12/22 1622) RPR: Non Reactive (04/07 0911)  HBsAg: Negative (12/22 1622)  HIV: Non Reactive (04/07 0911)  GBS: Negative/-- (06/06 1000)  2 hr Glucola wnl Genetic screening  NIPS low risk, AFP negative Anatomy US nl  Prenatal Transfer Tool  Maternal Diabetes: No Genetic Screening: Normal Maternal Ultrasounds/Referrals: Normal Fetal Ultrasounds or other Referrals:  None Maternal Substance Abuse:  No Significant Maternal Medications:  None Significant Maternal Lab Results: Group B Strep negative  Results for orders placed or performed during the hospital encounter of 12/11/20 (from the past 24 hour(s))  CBC   Collection Time: 12/11/20  2:46 AM  Result Value Ref Range   WBC 10.0 4.0 - 10.5 K/uL   RBC 3.99 3.87 - 5.11 MIL/uL   Hemoglobin 10.7 (L) 12.0 - 15.0 g/dL   HCT 29.5 (L) 62.1 - 30.8 %   MCV 82.7 80.0 - 100.0 fL   MCH 26.8 26.0 - 34.0 pg   MCHC 32.4 30.0 - 36.0 g/dL   RDW 65.7 (H) 84.6 - 96.2 %   Platelets 451 (H) 150 - 400 K/uL   nRBC 0.2 0.0 - 0.2 %  Comprehensive metabolic panel   Collection Time: 12/11/20  2:46 AM  Result Value Ref Range   Sodium 135 135 - 145 mmol/L   Potassium 3.8 3.5 - 5.1 mmol/L   Chloride 105 98 - 111 mmol/L   CO2 19 (L) 22 - 32 mmol/L   Glucose, Bld 105 (H) 70 - 99 mg/dL   BUN 9 6 - 20 mg/dL   Creatinine, Ser 9.52 0.44 - 1.00 mg/dL   Calcium 9.1 8.9 - 84.1 mg/dL   Total Protein 6.4 (L) 6.5 - 8.1 g/dL   Albumin 2.6 (L) 3.5 - 5.0 g/dL   AST 16 15 - 41 U/L   ALT 15 0 - 44 U/L   Alkaline Phosphatase 94 38 - 126 U/L   Total Bilirubin 0.7 0.3 - 1.2 mg/dL   GFR,  Estimated >32 >44 mL/min   Anion gap 11 5 - 15    Patient Active Problem List   Diagnosis Date Noted   [redacted] weeks gestation of pregnancy 12/05/2020   BMI 40.0-44.9, adult (HCC) 11/28/2020   Gestational hypertension 11/11/2020   Abdominal wall hernia 09/08/2020   Large  for gestational age fetus affecting management of mother, antepartum, third trimester, fetus 1 09/08/2020   Supervision of high risk pregnancy, antepartum 05/30/2020   AMA (advanced maternal age) multigravida 35+ 05/30/2020   Obesity during pregnancy 05/30/2020   Anxiety 09/02/2015   Mild intermittent asthma without complication 06/02/2015    Assessment/Plan:  Veyda Kaufman is a 35 y.o. G2P1001 at [redacted]w[redacted]d here for IOL secondary to gHTN  #IOL:   Latent. S/p Cytotec at 0400. Consider FB at next check, patient amenable. Anticipate NSVD, caution with macrosomia. #Pain: Epidural desired #FWB: Cat I #ID:  GBS negative #MOF: both #MOC: BTL desired, inpatient if possible though has not signed papers #Circ:  desired  Littie Deeds, MD  12/11/2020, 5:01 AM    I saw and evaluated the patient. I agree with the findings and the plan of care as documented in the resident's note.  Casper Harrison, MD [facility] Family Medicine Fellow, [facility] for [facility], [facility]

## 2020-12-11 NOTE — Anesthesia Procedure Notes (Signed)
Epidural Patient location during procedure: OB Start time: 12/11/2020 3:08 PM End time: 12/11/2020 3:19 PM  Staffing Anesthesiologist: Lannie Fields, DO Performed: anesthesiologist   Preanesthetic Checklist Completed: patient identified, IV checked, risks and benefits discussed, monitors and equipment checked, pre-op evaluation and timeout performed  Epidural Patient position: sitting Prep: DuraPrep and site prepped and draped Patient monitoring: continuous pulse ox, blood pressure, heart rate and cardiac monitor Approach: midline Location: L3-L4 Injection technique: LOR air  Needle:  Needle type: Tuohy  Needle gauge: 17 G Needle length: 9 cm Needle insertion depth: 6 cm Catheter type: closed end flexible Catheter size: 19 Gauge Catheter at skin depth: 11 cm Test dose: negative  Assessment Sensory level: T8 Events: blood not aspirated, injection not painful, no injection resistance, no paresthesia and negative IV test  Additional Notes Patient identified. Risks/Benefits/Options discussed with patient including but not limited to bleeding, infection, nerve damage, paralysis, failed block, incomplete pain control, headache, blood pressure changes, nausea, vomiting, reactions to medication both or allergic, itching and postpartum back pain. Confirmed with bedside nurse the patient's most recent platelet count. Confirmed with patient that they are not currently taking any anticoagulation, have any bleeding history or any family history of bleeding disorders. Patient expressed understanding and wished to proceed. All questions were answered. Sterile technique was used throughout the entire procedure. Please see nursing notes for vital signs. Test dose was given through epidural catheter and negative prior to continuing to dose epidural or start infusion. Warning signs of high block given to the patient including shortness of breath, tingling/numbness in hands, complete motor  block, or any concerning symptoms with instructions to call for help. Patient was given instructions on fall risk and not to get out of bed. All questions and concerns addressed with instructions to call with any issues or inadequate analgesia.  Reason for block:procedure for pain

## 2020-12-11 NOTE — Progress Notes (Signed)
Labor Progress Note Kadeidra Coryell is a 35 y.o. G2P1001 at [redacted]w[redacted]d presented for IOL for gHTN  S:  Feeling some ctx. Can breath through them. Declines need for pain meds.  O:  BP 123/82   Pulse 98   Temp 98.4 F (36.9 C) (Oral)   Resp 18   LMP 03/24/2020  EFM: baseline 130 bpm/ mod variability/ + accels/ no decels  Toco/IUPC: 2-4 SVE: Dilation: 2.5 Effacement (%): 50 Cervical Position: Posterior Station: -2 Presentation: Vertex Exam by:: Dr. Myriam Jacobson Pitocin: 4 mu/min  A/P: 35 y.o. G2P1001 [redacted]w[redacted]d  1. Labor: latent 2. FWB: Cat I 3. Pain: analgesia/anesthesia/NO prn 4. gHTN: stable  Continue Pitocin titration. AROM when indicated. Anticipate labor progress and SVD.  Donette Larry, CNM 10:08 AM

## 2020-12-11 NOTE — Progress Notes (Signed)
Labor Progress Note Emily Davila is a 35 y.o. G2P1001 at [redacted]w[redacted]d presented for IOL for gHTN  S:  Feeling more pain with ctx. Requesting pain meds.   O:  BP (!) 147/97   Pulse 89   Temp 98.4 F (36.9 C) (Oral)   Resp 18   LMP 03/24/2020  EFM: baseline 125 bpm/ mod variability/ + accels/ no decels  Toco/IUPC: 1.5-3 SVE: Dilation: 2 Effacement (%): 60 Cervical Position: Posterior Station: -2 Presentation: Vertex Exam by:: Keona Bilyeu B. CNM Pitocin: 10 mu/min  A/P: 35 y.o. G2P1001 [redacted]w[redacted]d  1. Labor: latent 2. FWB: Cat I 3. Pain: analgesia/anesthesia/NO prn 4. gHTN: stable  Continue Pitocin. Anticipate labor progress and SVD.  Donette Larry, CNM 1:49 PM

## 2020-12-12 ENCOUNTER — Encounter (HOSPITAL_COMMUNITY): Payer: Self-pay | Admitting: Family Medicine

## 2020-12-12 ENCOUNTER — Ambulatory Visit: Payer: Medicaid Other

## 2020-12-12 DIAGNOSIS — O3663X0 Maternal care for excessive fetal growth, third trimester, not applicable or unspecified: Secondary | ICD-10-CM

## 2020-12-12 DIAGNOSIS — Z3A37 37 weeks gestation of pregnancy: Secondary | ICD-10-CM

## 2020-12-12 DIAGNOSIS — O134 Gestational [pregnancy-induced] hypertension without significant proteinuria, complicating childbirth: Secondary | ICD-10-CM

## 2020-12-12 DIAGNOSIS — O411230 Chorioamnionitis, third trimester, not applicable or unspecified: Secondary | ICD-10-CM

## 2020-12-12 MED ORDER — IBUPROFEN 600 MG PO TABS
600.0000 mg | ORAL_TABLET | Freq: Four times a day (QID) | ORAL | Status: DC
  Administered 2020-12-12 – 2020-12-14 (×9): 600 mg via ORAL
  Filled 2020-12-12 (×9): qty 1

## 2020-12-12 MED ORDER — ONDANSETRON HCL 4 MG/2ML IJ SOLN: 4.0000 mg | INTRAMUSCULAR | Status: DC | PRN

## 2020-12-12 MED ORDER — BENZOCAINE-MENTHOL 20-0.5 % EX AERO
1.0000 "application " | INHALATION_SPRAY | CUTANEOUS | Status: DC | PRN
  Filled 2020-12-12: qty 56

## 2020-12-12 MED ORDER — DIPHENHYDRAMINE HCL 25 MG PO CAPS: 25.0000 mg | ORAL_CAPSULE | Freq: Four times a day (QID) | ORAL | Status: DC | PRN

## 2020-12-12 MED ORDER — PRENATAL MULTIVITAMIN CH
1.0000 | ORAL_TABLET | Freq: Every day | ORAL | Status: DC
  Administered 2020-12-13 – 2020-12-14 (×2): 1 via ORAL
  Filled 2020-12-12 (×2): qty 1

## 2020-12-12 MED ORDER — DIBUCAINE (PERIANAL) 1 % EX OINT: 1.0000 "application " | TOPICAL_OINTMENT | CUTANEOUS | Status: DC | PRN

## 2020-12-12 MED ORDER — WITCH HAZEL-GLYCERIN EX PADS: 1.0000 "application " | MEDICATED_PAD | CUTANEOUS | Status: DC | PRN

## 2020-12-12 MED ORDER — ONDANSETRON HCL 4 MG PO TABS: 4.0000 mg | ORAL_TABLET | ORAL | Status: DC | PRN

## 2020-12-12 MED ORDER — AMLODIPINE BESYLATE 5 MG PO TABS: 5.0000 mg | ORAL_TABLET | Freq: Every day | ORAL | Status: DC

## 2020-12-12 MED ORDER — TETANUS-DIPHTH-ACELL PERTUSSIS 5-2.5-18.5 LF-MCG/0.5 IM SUSY: 0.5000 mL | PREFILLED_SYRINGE | Freq: Once | INTRAMUSCULAR | Status: DC

## 2020-12-12 MED ORDER — ACETAMINOPHEN 325 MG PO TABS
650.0000 mg | ORAL_TABLET | Freq: Four times a day (QID) | ORAL | Status: DC
  Administered 2020-12-12 – 2020-12-14 (×9): 650 mg via ORAL
  Filled 2020-12-12 (×9): qty 2

## 2020-12-12 MED ORDER — SENNOSIDES-DOCUSATE SODIUM 8.6-50 MG PO TABS
2.0000 | ORAL_TABLET | Freq: Every day | ORAL | Status: DC
  Administered 2020-12-13 – 2020-12-14 (×2): 2 via ORAL
  Filled 2020-12-12 (×2): qty 2

## 2020-12-12 MED ORDER — SIMETHICONE 80 MG PO CHEW: 80.0000 mg | CHEWABLE_TABLET | ORAL | Status: DC | PRN

## 2020-12-12 MED ORDER — COCONUT OIL OIL
1.0000 "application " | TOPICAL_OIL | Status: DC | PRN
  Administered 2020-12-14: 1 via TOPICAL

## 2020-12-12 NOTE — Progress Notes (Signed)
Labor Progress Note Emily Davila is a 35 y.o. G2P1001 at [redacted]w[redacted]d presented for IOL for gHTN S:  Doing well overall   O:  BP 134/87   Pulse 88   Temp 98.6 F (37 C) (Oral)   Resp 18   LMP 03/24/2020   SpO2 99%  EFM: baseline 135/moderate variability/+accels/no decels IUPC: not tracing contractions (even s/p replacement)   Dilation: 8 Effacement (%): 100 Cervical Position: Middle Station: 0 Presentation: Vertex Exam by:: Jemima Petko MD    A&P: 35 y.o. G2P1001 [redacted]w[redacted]d for IOL for gHTN #IOL:  S/p Cytotec x1, AROM at 2030 with clear fluid.  Pitocin started at 0800, cut in half to 9 mu/min overnight and then stopped at 0130 due to recurrent decels. Continued to make cervical change off pitocin. IUPC replaced. Restarted at 0600 and will uptitrate 1x1.   #Triple I: new maternal fever to 100.61F as of 2341, now afebrile. On ampicillin-sulbactam. #Pain: epidural #FWB: cat I at this time  #GBS negative #gHTN: stable, intermittent mild range pressures  Emily Kudo, MD 7:12 AM

## 2020-12-12 NOTE — Lactation Note (Signed)
This note was copied from a baby's chart. Lactation Consultation Note Baby 9 hrs old. Mom latched baby in cradle position. Baby BF well. Newborn behavior, STS, I&O, positioning, body alignment, support, breast massage, supply and demand.  Mom has a 37 yr old that she BF for 1 month. Mom's choice of feeding is BF/formula feeding. Baby hasn't had anything other than breast. Gave mom sheet on supplementing. Encouraged BF first and wait for supplementing as long as possible so can BF more to get milk to come in.  Lactation brochure given.  Patient Name: Emily Davila Today's Date: 12/12/2020 Reason for consult: Initial assessment;Early term 37-38.6wks Age:69 hours  Maternal Data Has patient been taught Hand Expression?: Yes Does the patient have breastfeeding experience prior to this delivery?: Yes How long did the patient breastfeed?: 1 month  Feeding    LATCH Score Latch: Grasps breast easily, tongue down, lips flanged, rhythmical sucking.  Audible Swallowing: A few with stimulation  Type of Nipple: Everted at rest and after stimulation  Comfort (Breast/Nipple): Soft / non-tender  Hold (Positioning): Assistance needed to correctly position infant at breast and maintain latch.  LATCH Score: 8   Lactation Tools Discussed/Used    Interventions Interventions: Breast feeding basics reviewed;Support pillows;Assisted with latch;Position options;Skin to skin;Breast massage;Adjust position;Breast compression  Discharge WIC Program: Yes  Consult Status Consult Status: Follow-up Date: 12/13/20 Follow-up type: In-patient    Charyl Dancer 12/12/2020, 11:29 PM

## 2020-12-12 NOTE — Lactation Note (Signed)
This note was copied from a baby's chart. Lactation Consultation Note  Patient Name: Emily Davila Today's Date: 12/12/2020 Reason for consult: L&D Initial assessment;Early term 37-38.6wks Age:37 hours  LC in to assist in L&D.  Mom is a P2 that breastfed her first (8 yr old) for a month.  Mom desires to breastfeed and bottle feed baby.  Baby latched easily once STS within the first hour of life for 35 mins.  Mom aware of importance of helping baby to latch on deeply and not pinch the nipple.    Baby under warmer currently with some slight tachypnea and low temp of 97.4.  Recommended that baby go back STS with Mom. Mom requested baby to be STS with FOB.  FOB reclined in chair with a blanket over baby.  Reviewed breast massage and hand expression.  Unable to see any colostrum currently, but encouraged Mom to prime the breast before latching baby.  Encouraged Mom to do STS with baby as much as possible, offering the breast with cues.  Goal is to feed baby 8 or more times per 24 hrs.  Mom states she did Emergency planning/management officer education through [facility].    Mom encouraged to call for assistance prn.  Maternal Data Has patient been taught Hand Expression?: Yes Does the patient have breastfeeding experience prior to this delivery?: Yes How long did the patient breastfeed?: 1 month  Feeding Mother's Current Feeding Choice: Breast Milk and Formula  Interventions Interventions: Skin to skin;Breast massage;Hand express;Breast feeding basics reviewed  Consult Status Consult Status: Follow-up Date: 12/12/20 Follow-up type: In-patient    Emily Davila 12/12/2020, 3:03 PM

## 2020-12-12 NOTE — Discharge Summary (Signed)
   Postpartum Discharge Summary      Patient Name: Emily Davila DOB: 11/30/1985 MRN: [id number]  Date of admission: 12/11/2020 Delivery date:12/12/2020  Delivering provider: GOSWICK, ANNA E  Date of discharge: 12/14/2020  Admitting diagnosis: Gestational hypertension [O13.9] Intrauterine pregnancy: [redacted]w[redacted]d     Secondary diagnosis:  Principal Problem:   Vaginal delivery Active Problems:   AMA (advanced maternal age) multigravida 35+   Obesity during pregnancy   Mild intermittent asthma without complication   Anxiety   Abdominal wall hernia   Gestational hypertension   BMI 40.0-44.9, adult (HCC)   Anemia affecting pregnancy  Additional problems: as noted above  Discharge diagnosis: Term Pregnancy Delivered                                              Post partum procedures: none Augmentation: AROM, Pitocin, and Cytotec Complications: Triple I                             Anemia postpartum  Hospital course: Induction of Labor With Vaginal Delivery   35 y.o. yo G2P2002 at [redacted]w[redacted]d was admitted to the hospital 12/11/2020 for induction of labor. On admission, pt received cytotec and was then transitioned to pitocin. She had AROM for clear fluid at 2030 on 12/11/20. Given fever to 100.8F pt was treated for Triple I with Unasyn. She progressed to complete cervical dilation with up-titration of pitocin. Indication for induction: Gestational hypertension.  Patient had an uncomplicated labor course as follows: Membrane Rupture Time/Date: 8:26 PM ,12/11/2020   Delivery Method:Vaginal, Spontaneous  Episiotomy: None  Lacerations:  None  Details of delivery can be found in separate delivery note.  Patient had a routine postpartum course. Patient is discharged home 12/14/20.  Newborn Data: Birth date:12/12/2020  Birth time:1:38 PM  Gender:Female  Living status:Living  Apgars:8 ,9  Weight:3521 g   Magnesium Sulfate received: No BMZ received: No Rhophylac:N/A MMR:N/A T-DaP:Given  prenatally Flu: N/Aoffered prior to discharge Transfusion:No  Physical exam  Vitals:   12/13/20 0100 12/13/20 0500 12/13/20 1456 12/13/20 2237  BP: 95/62 125/74 (!) 144/87 (!) 145/88  Pulse: 72 95  77  Resp: 17 17 18 18  Temp: (!) 97.3 F (36.3 C) 97.8 F (36.6 C) 98.6 F (37 C) 98.2 F (36.8 C)  TempSrc: Oral Oral Oral Oral  SpO2: 100% 99% 99% 100%   General: alert, cooperative, and no distress Lochia: appropriate Uterine Fundus: firm Incision: N/A DVT Evaluation: No evidence of DVT seen on physical exam. Labs: Lab Results  Component Value Date   WBC 22.2 (H) 12/13/2020   HGB 8.5 (L) 12/13/2020   HCT 26.5 (L) 12/13/2020   MCV 83.9 12/13/2020   PLT 308 12/13/2020   CMP Latest Ref Rng & Units 12/11/2020  Glucose 70 - 99 mg/dL 105(H)  BUN 6 - 20 mg/dL 9  Creatinine 0.44 - 1.00 mg/dL 0.51  Sodium 135 - 145 mmol/L 135  Potassium 3.5 - 5.1 mmol/L 3.8  Chloride 98 - 111 mmol/L 105  CO2 22 - 32 mmol/L 19(L)  Calcium 8.9 - 10.3 mg/dL 9.1  Total Protein 6.5 - 8.1 g/dL 6.4(L)  Total Bilirubin 0.3 - 1.2 mg/dL 0.7  Alkaline Phos 38 - 126 U/L 94  AST 15 - 41 U/L 16  ALT 0 - 44 U/L 15   Edinburgh   Score: Edinburgh Postnatal Depression Scale Screening Tool 12/13/2020  I have been able to laugh and see the funny side of things. (No Data)     After visit meds:  Allergies as of 12/14/2020       Reactions   Latex Itching   Keflex [cephalexin] Itching   Pt has tolerated amoxicillin in the past        Medication List     TAKE these medications    acetaminophen 500 MG tablet Commonly known as: TYLENOL Take 1,000 mg by mouth every 6 (six) hours as needed.   albuterol 108 (90 Base) MCG/ACT inhaler Commonly known as: VENTOLIN HFA Inhale 1 puff into the lungs every 4 (four) hours as needed for wheezing or shortness of breath.   ferrous sulfate 325 (65 FE) MG tablet Commonly known as: FerrouSul Take 1 tablet (325 mg total) by mouth 2 (two) times daily.   ibuprofen  600 MG tablet Commonly known as: ADVIL Take 1 tablet (600 mg total) by mouth every 6 (six) hours.   loratadine 10 MG tablet Commonly known as: CLARITIN Take 10 mg by mouth daily.   PRENATAL VITAMIN PO Take 1 tablet by mouth daily.         Discharge home in stable condition Infant Feeding:  breast and formula Infant Disposition:home with mother Discharge instruction: per After Visit Summary and Postpartum booklet. Activity: Advance as tolerated. Pelvic rest for 6 weeks.  Diet: routine diet Future Appointments:No future appointments. Follow up Visit:  [facility] Follow up in 1 week(s).   Specialty: Obstetrics and Gynecology Why: Someone from our office will call you for BP check Contact information: [place] [address] [phone]               Message sent to [facility] by Dr. Astrid Drafts  Please schedule this patient for a In person postpartum visit in 6 weeks with the following provider: Any provider. Additional Postpartum F/U:BP check 1 week, pre-op appointment with Dr. Elgie Congo in 2 weeks Low risk pregnancy complicated by:  gHTN, diagnosis of Triple I in labor Delivery mode:  Vaginal, Spontaneous  Anticipated Birth Control:   plan for interval BTL with Dr. Elgie Congo   12/14/2020 Hansel Feinstein, CNM

## 2020-12-12 NOTE — Lactation Note (Signed)
This note was copied from a baby's chart. Lactation Consultation Note Attempted to see mom. Mom will call for next feeding.  Patient Name: Emily Davila Today's Date: 12/12/2020   Age:37 hours  Maternal Data    Feeding    LATCH Score                    Lactation Tools Discussed/Used    Interventions    Discharge    Consult Status      Charyl Dancer 12/12/2020, 10:01 PM

## 2020-12-12 NOTE — Progress Notes (Addendum)
Labor Progress Note Emily Davila is a 35 y.o. G2P1001 at [redacted]w[redacted]d presented for IOL for gHTN S:  Evaluated patient at bedside due to concern for decelerations on FHT. Patient concerned about fetal wellbeing, no other concerns expressed.  O:  BP 111/69   Pulse (!) 120   Temp 99.1 F (37.3 C)   Resp 16   LMP 03/24/2020   SpO2 99%  EFM: baseline 160/moderate variability/+accels/prolonged decel (4 min), variable decels Toco: contractions every 2-5 min  Dilation: 6 Effacement (%): 100 Cervical Position: Posterior Station: 0 Presentation: Vertex Exam by:: Huntley Dec, RN    A&P: 35 y.o. G2P1001 [redacted]w[redacted]d for IOL for gHTN #IOL: No significant change since last cervical exam. S/p Cytotec x1, AROM at 2030 with clear fluid. IUPC in place w amnioinfusion. On Pitocin since 0800, cut in half to 9 mu/min given FHT. Had another decel so will turn off Pitocin and plan to restart slowly in an hour.  Baby suspected LGA. Shoulder dystocia precautions at delivery. #Triple I: new maternal fever to 100.53F as of 2341, now afebrile. On ampicillin-sulbactam. #Pain: epidural #FWB: cat II, now cat I since turning Pitocin off, amnioinfusion ongoing. Reassuringly FHR in normal range now after antibiotics #GBS negative #gHTN: stable, intermittent mild range pressures  Emily Deeds, MD 1:41 AM   I saw and evaluated the patient. I agree with the findings and the plan of care as documented in the resident's note.  Casper Harrison, MD [facility] Fellow, [facility] for [facility], [facility]

## 2020-12-13 LAB — CBC
HCT: 26.5 % — ABNORMAL LOW (ref 36.0–46.0)
Hemoglobin: 8.5 g/dL — ABNORMAL LOW (ref 12.0–15.0)
MCH: 26.9 pg (ref 26.0–34.0)
MCHC: 32.1 g/dL (ref 30.0–36.0)
MCV: 83.9 fL (ref 80.0–100.0)
Platelets: 308 10*3/uL (ref 150–400)
RBC: 3.16 MIL/uL — ABNORMAL LOW (ref 3.87–5.11)
RDW: 15.6 % — ABNORMAL HIGH (ref 11.5–15.5)
WBC: 22.2 10*3/uL — ABNORMAL HIGH (ref 4.0–10.5)
nRBC: 0 % (ref 0.0–0.2)

## 2020-12-13 NOTE — Lactation Note (Signed)
This note was copied from a baby's chart. Lactation Consultation Note  Patient Name: Boy Mayari Matus Today's Date: 12/13/2020   Age:37 hours  Attempted to visit with mom but lab team was working with baby doing lab work for next serum bilirubin. LC to come back later today for follow up assessment.  Maternal Data    Feeding    LATCH Score                    Lactation Tools Discussed/Used    Interventions    Discharge    Consult Status      Maja Mccaffery Venetia Constable 12/13/2020, 1:50 PM

## 2020-12-13 NOTE — Social Work (Deleted)
CSW escorted [facility] CPS SW Bradly Bienenstock to patient's room for assessment.   No barriers to discharge.   Manfred Arch, MSW, [employer] Clinical Social Work [employer] and [employer] [phone]

## 2020-12-13 NOTE — Social Work (Signed)
MOB was referred for history of anxiety.   * Referral screened out by Clinical Social Worker because none of the following criteria appear to apply:  ~ History of anxiety/depression during this pregnancy, or of post-partum depression following prior delivery. MOB reported Feb. 2022 that she has never had anxiety. ~ Diagnosis of anxiety and/or depression within last 3 years. OR * MOB's symptoms currently being treated with medication and/or therapy.  Please contact the Clinical Social Worker if needs arise, by MOB request, or if MOB scores greater than 9/yes to question 10 on Edinburgh Postpartum Depression Screen.  Amarionna Arca, LCSWA Clinical Social Work [facility]  [phone]  

## 2020-12-13 NOTE — Lactation Note (Signed)
This note was copied from a baby's chart. Lactation Consultation Note  Patient Name: Emily Davila Today's Date: 12/13/2020 Reason for consult: Follow-up assessment;Early term 37-38.6wks;Hyperbilirubinemia;Infant weight loss Age:37 hours  Visited with mom of 25 hours old ETI female, she's a P2 but she only BF her first child for a month.   LC noticed that RN had brought a hand pump, explained to parents that a DEBP will be more efficient to supplement baby with mother's milk (first choice for supplementation) since baby's bilirubin is elevated.  Mom voiced she's been putting baby to breast consistently weight loss is only 1% and he's had several voids and stools but last serum bilirubin was at the high risk zone.   Explained to parents options for supplementation (mother's milk, donor milk and formula as the last resource) in order to offset hyperbilirubinemia but mother only wants to put baby to breast, she's not interested in pumping or supplementing at this point.  LC respected mother's wishes, but provider is still awaiting second serum bilirubin before starting phototherapy; if indicated. Reviewed newborn jaundice, lactogenesis II and feeding cues.  Feeding plan:  Encouraged mom to keep putting baby to breast 8-12 times/24 hours or sooner if feeding cues are present She has decided not to supplement or pump at this point, will wait for second serum bilirubin results  FOB present at the time of [facility] consultation; he was holding baby STS. Parents reported all questions and concerns were answered, they're both aware of LC OP services and will call PRN.  Maternal Data    Feeding Mother's Current Feeding Choice: Breast Milk  LATCH Score                    Lactation Tools Discussed/Used Tools: Pump Breast pump type: Manual Pump Education: Setup, frequency, and cleaning Reason for Pumping: induction of lactation, hyperbilirubinemia Pumping frequency: after feedings at  the breast, but mom hasn't used the pump yet (RN set it up) Pumped volume: 0 mL  Interventions Interventions: Breast feeding basics reviewed;Education;Hand pump  Discharge Pump: Manual  Consult Status Consult Status: Follow-up Date: 12/14/20 Follow-up type: In-patient    Emily Davila 12/13/2020, 2:48 PM

## 2020-12-13 NOTE — Progress Notes (Signed)
Post Partum Day 1 Subjective:  Patient is doing well without complaints. Ambulating without difficulty. Voiding and passing flatus. Tolerating PO. Abdominal pain improved. Vaginal bleeding decreased.  Objective: Blood pressure 125/74, pulse 95, temperature 97.8 F (36.6 C), temperature source Oral, resp. rate 17, last menstrual period 03/24/2020, SpO2 99 %, unknown if currently breastfeeding.  Physical Exam:  General: alert, cooperative, and no distress Lochia: appropriate Uterine Fundus: firm Incision: n/a DVT Evaluation: No evidence of DVT seen on physical exam.  Recent Labs    12/11/20 0246  HGB 10.7*  HCT 33.0*    Assessment/Plan: PPD#1 VD  -doing well, meeting PP milestones  -breast and bottle feeding  -desires circ for baby, consented  -desires BTL, message sent by [facility] 6/20 for appt with Donavan Foil  #gHTN  -BP initially elevated 130s pp, now wnl  -asymptomatic  -continue to monitor  #triple I  -afebrile  Plan for discharge tomorrow.   LOS: 2 days   Alric Seton 12/13/2020, 5:17 AM

## 2020-12-13 NOTE — Anesthesia Postprocedure Evaluation (Signed)
Anesthesia Post Note  Patient: Leisure centre manager  Procedure(s) Performed: AN AD HOC LABOR EPIDURAL     Patient location during evaluation: Mother Baby Anesthesia Type: Epidural Level of consciousness: awake and alert and oriented Pain management: satisfactory to patient Vital Signs Assessment: post-procedure vital signs reviewed and stable Respiratory status: respiratory function stable Cardiovascular status: stable Postop Assessment: no headache, no backache, epidural receding, patient able to bend at knees, no signs of nausea or vomiting, adequate PO intake and able to ambulate Anesthetic complications: no   No notable events documented.  Last Vitals:  Vitals:   12/13/20 0100 12/13/20 0500  BP: 95/62 125/74  Pulse: 72 95  Resp: 17 17  Temp: (!) 36.3 C 36.6 C  SpO2: 100% 99%    Last Pain:  Vitals:   12/13/20 0500  TempSrc: Oral  PainSc: 5    Pain Goal:                   Emily Davila

## 2020-12-14 ENCOUNTER — Ambulatory Visit: Payer: Self-pay

## 2020-12-14 ENCOUNTER — Other Ambulatory Visit (HOSPITAL_COMMUNITY): Payer: Self-pay

## 2020-12-14 ENCOUNTER — Encounter (HOSPITAL_COMMUNITY): Payer: Self-pay | Admitting: Family Medicine

## 2020-12-14 DIAGNOSIS — O99019 Anemia complicating pregnancy, unspecified trimester: Secondary | ICD-10-CM

## 2020-12-14 HISTORY — DX: Anemia complicating pregnancy, unspecified trimester: O99.019

## 2020-12-14 MED ORDER — IBUPROFEN 600 MG PO TABS
600.0000 mg | ORAL_TABLET | Freq: Four times a day (QID) | ORAL | 0 refills | Status: AC
  Filled 2020-12-14: qty 30, 8d supply, fill #0

## 2020-12-14 MED ORDER — FERROUS SULFATE 325 (65 FE) MG PO TABS
325.0000 mg | ORAL_TABLET | Freq: Two times a day (BID) | ORAL | 1 refills | Status: AC
  Filled 2020-12-14: qty 60, 30d supply, fill #0

## 2020-12-14 MED ORDER — AMLODIPINE BESYLATE 5 MG PO TABS
5.0000 mg | ORAL_TABLET | Freq: Every day | ORAL | 11 refills | Status: AC
  Filled 2020-12-14: qty 30, 30d supply, fill #0

## 2020-12-14 MED ORDER — AMLODIPINE BESYLATE 5 MG PO TABS
5.0000 mg | ORAL_TABLET | Freq: Every day | ORAL | Status: DC
  Administered 2020-12-14: 5 mg via ORAL
  Filled 2020-12-14: qty 1

## 2020-12-14 NOTE — Lactation Note (Signed)
This note was copied from a baby's chart. Lactation Consultation Note  Patient Name: Emily Davila Date: 12/14/2020 Reason for consult: Follow-up assessment;Early term 37-38.6wks;Infant weight loss Age:37 hours Mom resting in bed with baby, baby asleep.  Per mom baby last fed at 6:20, and milk is flowing easier and more swallows.  Serum Bilirubin drawn this am - 11.3 / baby off the lights as of now.  LC reviewed D/C teaching as a potential D/C on the list.  Mom was familiar with engorgement tx.   Maternal Data    Feeding Mother's Current Feeding Choice: Breast Milk  LATCH Score                    Lactation Tools Discussed/Used Tools: Pump Breast pump type: Manual Pump Education: Milk Storage  Interventions Interventions: Breast feeding basics reviewed;Education;Hand pump  Discharge Discharge Education: Engorgement and breast care;Warning signs for feeding baby Pump: Personal;DEBP;Manual  Consult Status Consult Status: Complete Date: 12/14/20    Kathrin Greathouse 12/14/2020, 8:51 AM

## 2020-12-14 NOTE — Lactation Note (Addendum)
This note was copied from a baby's chart. Lactation Consultation Note LC asked mom how BF was going mom stated really good. Discussed w/mom about supplementing d/t bili level high. Mom declined d/t mom stated she has a lot of "milk milk" coming from her breast.  LC offered to set up DEBP mom declined. Baby is on double photo therapy. LC mentioned to mom that supplementing w/her BM or DM helps baby get pop out and lowers bili levels because bili excretes through stool. Mom declined stating he is getting enough from her breast. Encouraged mom to call for assistance or questions. No charge.  Patient Name: Emily Davila [id number]'P Date: 12/14/2020   Age:47 hours  Maternal Data    Feeding    LATCH Score                    Lactation Tools Discussed/Used    Interventions    Discharge    Consult Status      Charyl Dancer 12/14/2020, 10:17 PM

## 2021-01-26 ENCOUNTER — Ambulatory Visit (INDEPENDENT_AMBULATORY_CARE_PROVIDER_SITE_OTHER): Payer: Medicaid Other | Admitting: Obstetrics and Gynecology

## 2021-01-26 ENCOUNTER — Other Ambulatory Visit: Payer: Self-pay

## 2021-01-26 DIAGNOSIS — O165 Unspecified maternal hypertension, complicating the puerperium: Secondary | ICD-10-CM

## 2021-01-26 MED ORDER — LABETALOL HCL 200 MG PO TABS: 200.0000 mg | ORAL_TABLET | Freq: Two times a day (BID) | ORAL | 3 refills | Status: AC

## 2021-01-26 NOTE — Progress Notes (Signed)
Post Partum Visit Note  Emily Davila is a 35 y.o. G62P2002 female who presents for a postpartum visit. She is 6 weeks postpartum following a normal spontaneous vaginal delivery.  I have fully reviewed the prenatal and intrapartum course. The delivery was at 37.4 gestational weeks.  Anesthesia: epidural. Postpartum course has been uncomplicated. Baby is doing well. Baby is feeding by both breast and bottle - nutramigen . Bleeding  stopped and returned this week, but has not trailed off . Bowel function is normal. Bladder function is normal. Patient is not sexually active. Contraception method is IUD. Postpartum depression screening: negative.   The pregnancy intention screening data noted above was reviewed. Potential methods of contraception were discussed. The patient elected to proceed with IUD.   Edinburgh Postnatal Depression Scale - 01/26/21 1028       Edinburgh Postnatal Depression Scale:  In the Past 7 Days   I have been able to laugh and see the funny side of things. 0    I have looked forward with enjoyment to things. 0    I have blamed myself unnecessarily when things went wrong. 0    I have been anxious or worried for no good reason. 0    I have felt scared or panicky for no good reason. 0    Things have been getting on top of me. 1    I have been so unhappy that I have had difficulty sleeping. 0    I have felt sad or miserable. 0    I have been so unhappy that I have been crying. 0    The thought of harming myself has occurred to me. 0    Edinburgh Postnatal Depression Scale Total 1             Health Maintenance Due  Topic Date Due   COVID-19 Vaccine (1) Never done   INFLUENZA VACCINE  01/23/2021    The following portions of the patient's history were reviewed and updated as appropriate: allergies, current medications, past family history, past medical history, past social history, past surgical history, and problem list.  Review of Systems Pertinent items  are noted in HPI.  Objective:  BP (!) 162/111   Pulse 89   Wt 226 lb (102.5 kg)   Breastfeeding Yes   BMI 36.48 kg/m    General:  alert, cooperative, appears stated age, and mild distress   Breasts:  not indicated  Lungs: clear to auscultation bilaterally  Heart:  regular rate and rhythm  Abdomen: soft, non-tender; bowel sounds normal; no masses,  no organomegaly   Wound N/a  GU exam:  not indicated       Assessment:     normal postpartum exam.   Plan:   Essential components of care per ACOG recommendations:  1.  Mood and well being: Patient with negative depression screening today. Reviewed local resources for support.  - Patient tobacco use? No.   - hx of drug use? No.    2. Infant care and feeding:  -Patient currently breastmilk feeding? Yes. Reviewed importance of draining breast regularly to support lactation.  -Social determinants of health (SDOH) reviewed in EPIC. No concerns.  3. Sexuality, contraception and birth spacing - Patient does not want a pregnancy in the next year.  Desired family size is 2 children.  - Reviewed forms of contraception in tiered fashion. Patient desired IUD today.   - Discussed birth spacing of 18 months  4. Sleep and fatigue -Encouraged family/partner/community  support of 4 hrs of uninterrupted sleep to help with mood and fatigue  5. Physical Recovery  - Discussed patients delivery and complications. She describes her labor as good. - Patient had a Vaginal, no problems at delivery. Patient had no lacerations. Perineal healing reviewed. Patient expressed understanding - Patient has urinary incontinence? No. - Patient is safe to resume physical and sexual activity  6.  Health Maintenance - HM due items addressed Yes - Last pap smear within normal limits Pap smear not done at today's visit.  -Breast Cancer screening indicated? No.   7. Chronic Disease/Pregnancy Condition follow up: Hypertension Will check BP at time of IUD  placement Rx for labetalol 200 mg po BID sent  - PCP follow up  Warden Fillers, MD Center for [facility], [facility]

## 2021-02-03 ENCOUNTER — Other Ambulatory Visit: Payer: Self-pay

## 2021-02-03 ENCOUNTER — Ambulatory Visit (INDEPENDENT_AMBULATORY_CARE_PROVIDER_SITE_OTHER): Payer: Medicaid Other | Admitting: Obstetrics and Gynecology

## 2021-02-03 VITALS — BP 150/105 | HR 81 | Ht 65.5 in | Wt 218.3 lb

## 2021-02-03 DIAGNOSIS — O165 Unspecified maternal hypertension, complicating the puerperium: Secondary | ICD-10-CM

## 2021-02-03 DIAGNOSIS — Z30430 Encounter for insertion of intrauterine contraceptive device: Secondary | ICD-10-CM

## 2021-02-03 DIAGNOSIS — Z3202 Encounter for pregnancy test, result negative: Secondary | ICD-10-CM | POA: Diagnosis not present

## 2021-02-03 HISTORY — DX: Encounter for insertion of intrauterine contraceptive device: Z30.430

## 2021-02-03 LAB — POCT PREGNANCY, URINE: Preg Test, Ur: NEGATIVE

## 2021-02-03 MED ORDER — LEVONORGESTREL 20.1 MCG/DAY IU IUD
1.0000 | INTRAUTERINE_SYSTEM | Freq: Once | INTRAUTERINE | Status: AC
  Administered 2021-02-03: 1 via INTRAUTERINE

## 2021-02-03 NOTE — Progress Notes (Signed)
    GYNECOLOGY OFFICE PROCEDURE NOTE  Emily Davila is a 35 y.o. [id number] here for [place] IUD insertion. No GYN concerns.  Last pap smear was on 07/23/19 and was normal.  IUD Insertion Procedure Note Patient identified, informed consent performed, consent signed.   Discussed risks of irregular bleeding, cramping, infection, malpositioning or misplacement of the IUD outside the uterus which may require further procedure such as laparoscopy. Also discussed >99% contraception efficacy, increased risk of ectopic pregnancy with failure of method.  Time out was performed.  Urine pregnancy test negative.  Speculum placed in the vagina.  Cervix visualized.  Cleaned with Betadine x 2.  Grasped anteriorly with a single tooth tenaculum.  Uterus sounded to 7.2 cm.  Liletta IUD placed per manufacturer's recommendations.  Strings trimmed to 3 cm. Tenaculum was removed, good hemostasis noted.  Patient tolerated procedure well.   Patient was given post-procedure instructions.  She was advised to have backup contraception for one week.  Patient was also asked to check IUD strings periodically and follow up in 4 weeks for IUD check. Will recheck BP at string check.  Mariel Aloe, MD, FACOG Obstetrician & Gynecologist, [facility] for [facility], [facility]

## 2021-02-03 NOTE — Progress Notes (Signed)
BP recheck 136/90  Wynona Canes, CMA

## 2021-02-03 NOTE — Addendum Note (Signed)
Addended by: Denyce Robert E on: 02/03/2021 12:07 PM   Modules accepted: Orders

## 2021-03-16 ENCOUNTER — Ambulatory Visit: Payer: Medicaid Other | Admitting: Obstetrics and Gynecology

## 2021-07-04 IMAGING — US US FETAL BPP W/ NON-STRESS
1 series · 14 of 14 positions shown · non-contrast
Comparison: none

[Series 1: us fetal bpp w/ non-stress · 14 acquisitions, 14 frames shown]
[im 1/14]
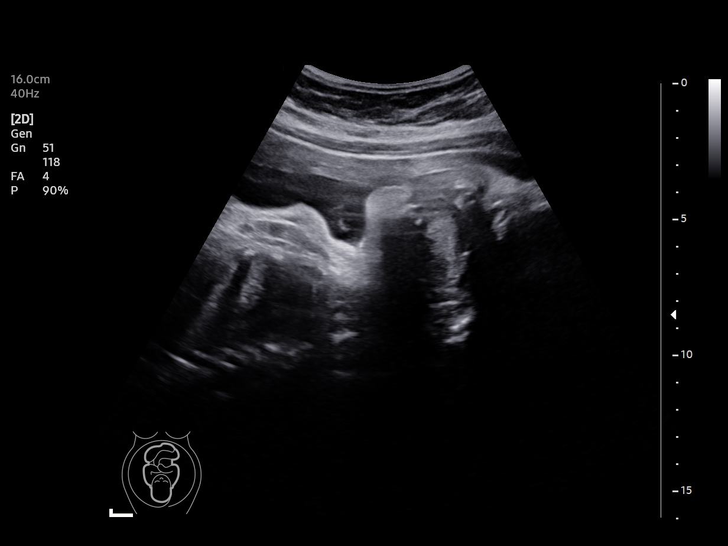
[im 2/14]
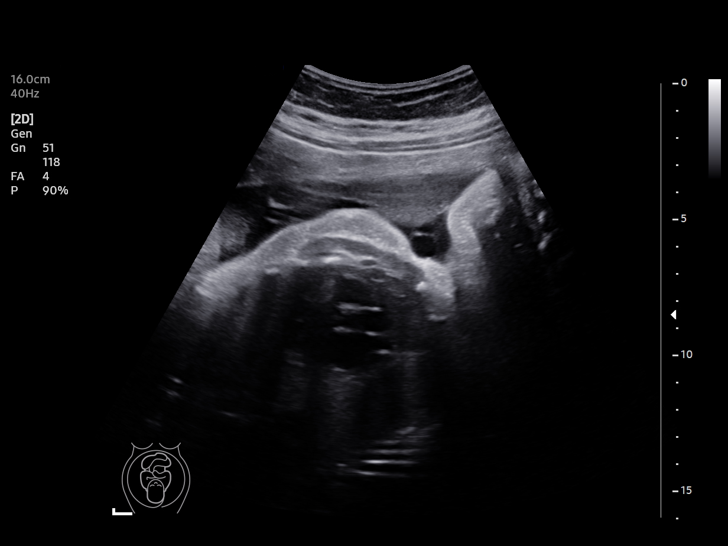
[im 3/14]
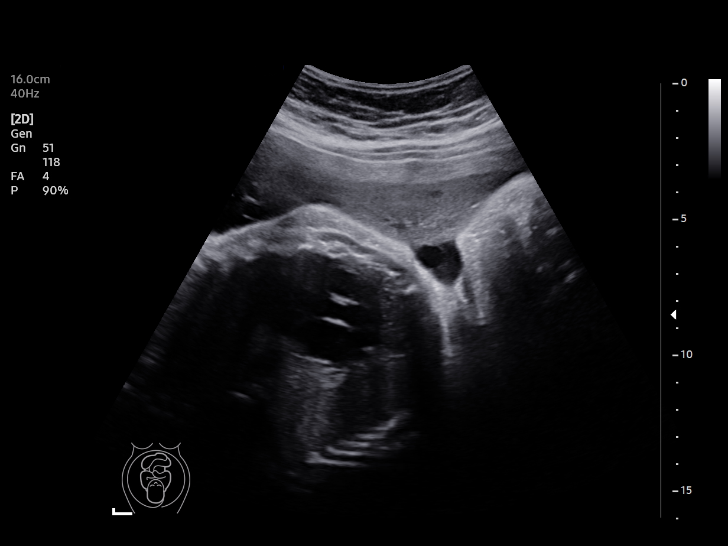
[im 4/14]
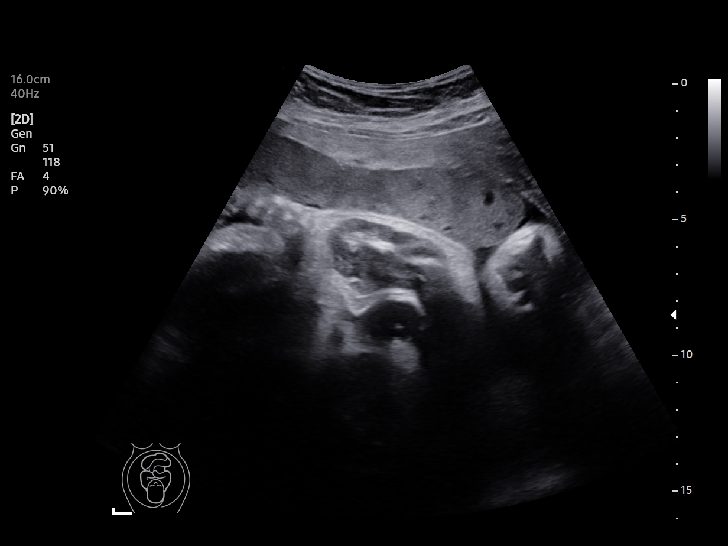
[im 5/14]
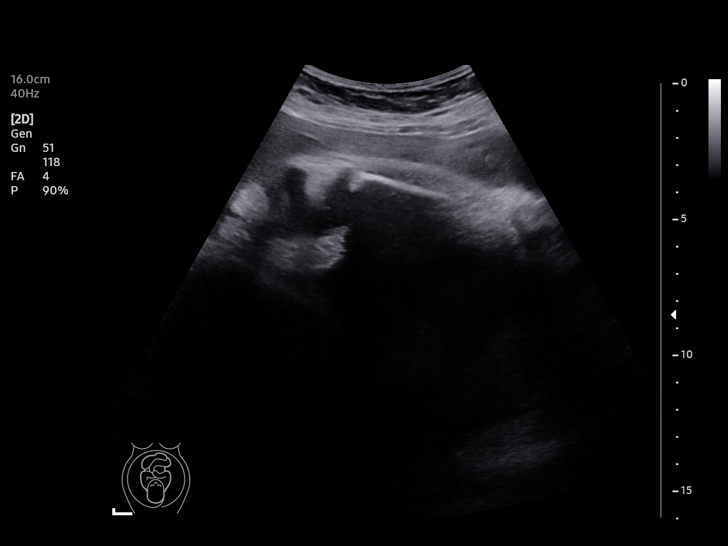
[im 6/14]
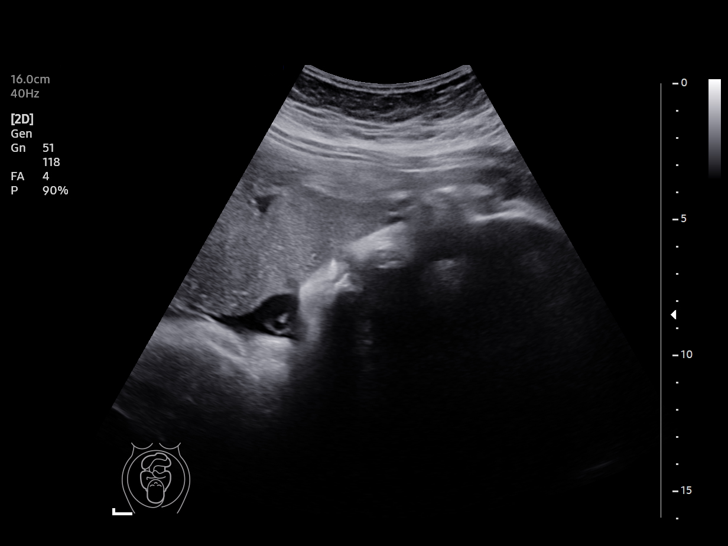
[im 7/14]
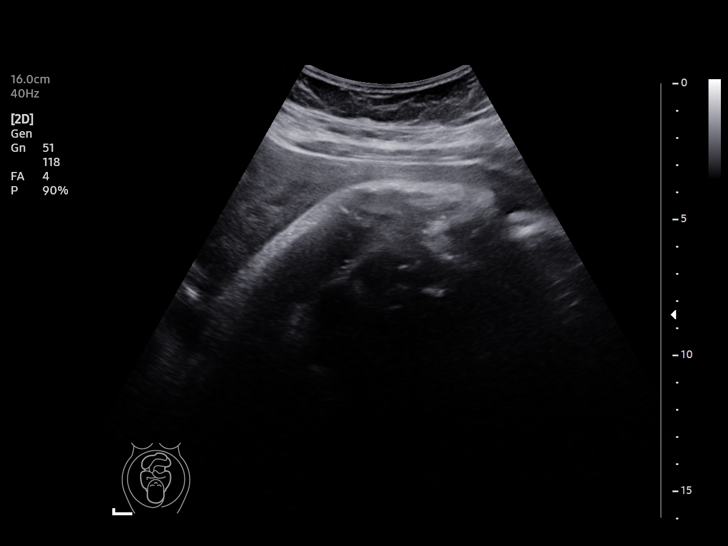
[im 8/14]
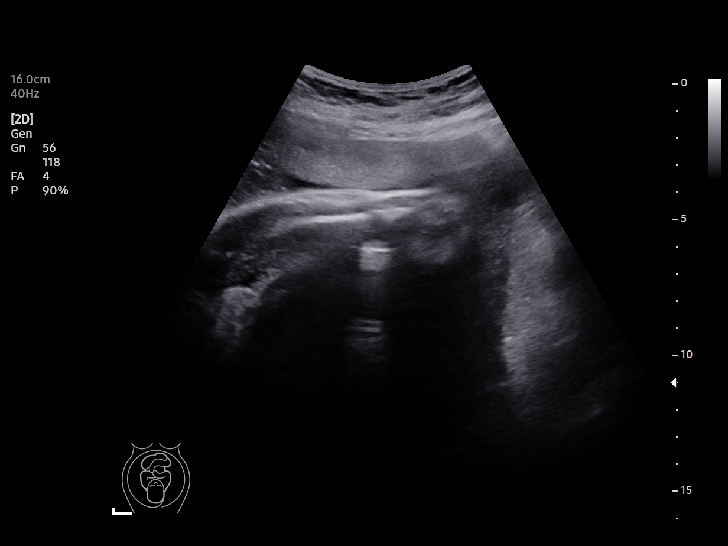
[im 9/14]
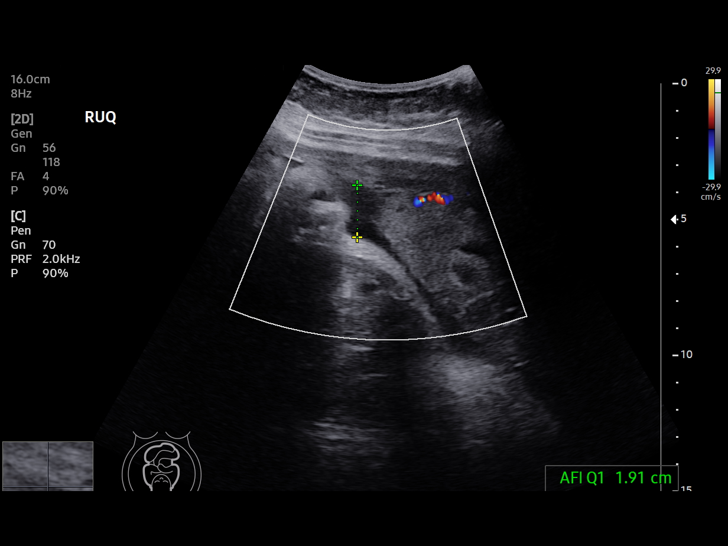
[im 10/14]
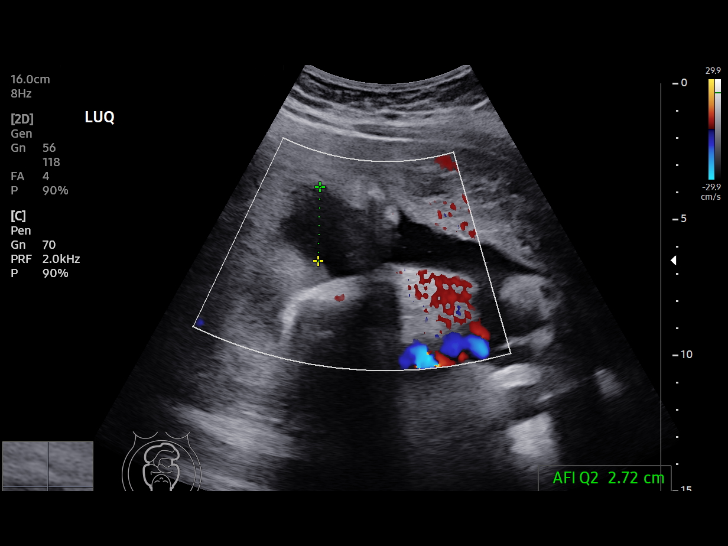
[im 11/14]
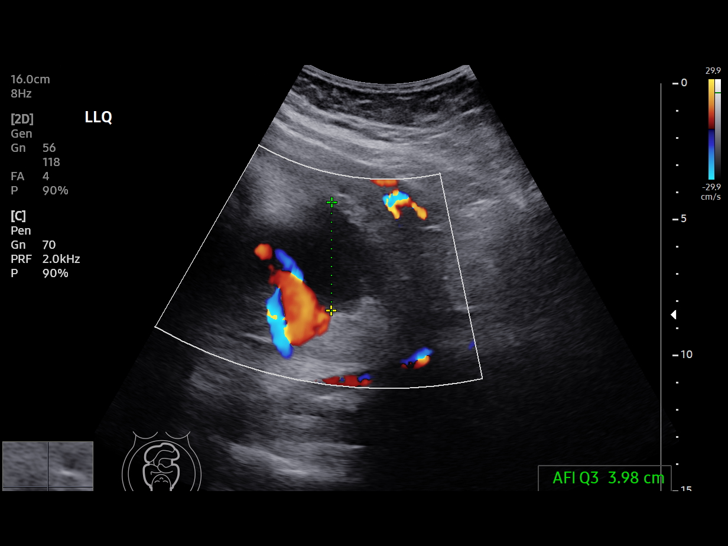
[im 12/14]
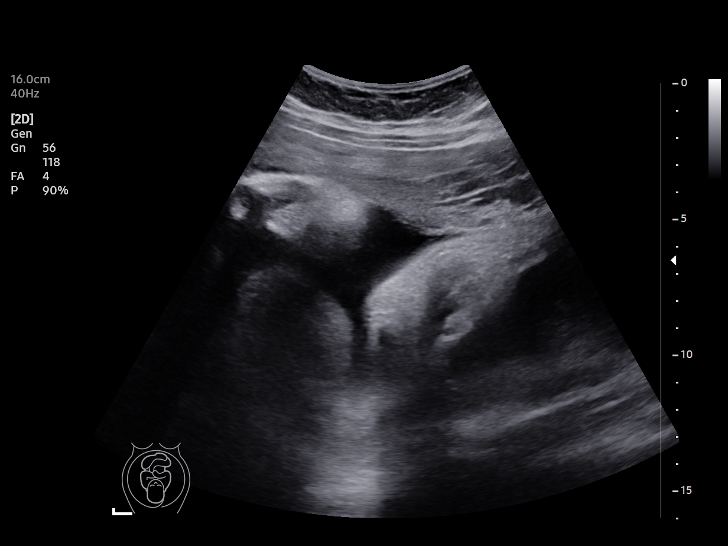
[im 13/14]
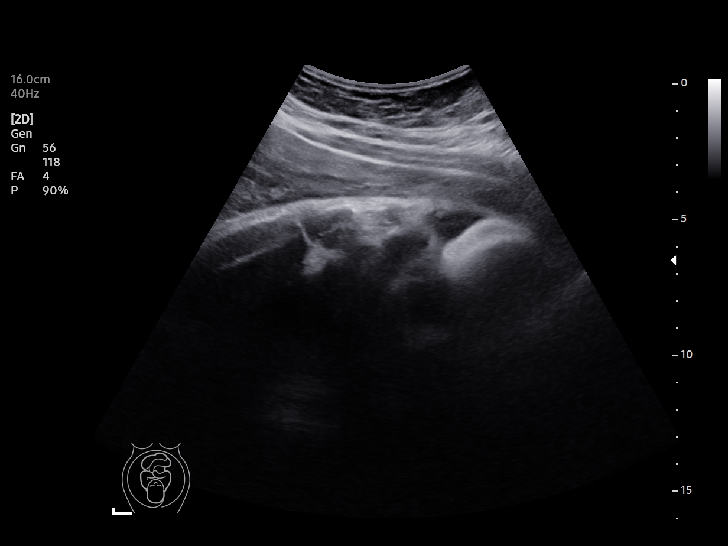
[im 14/14]
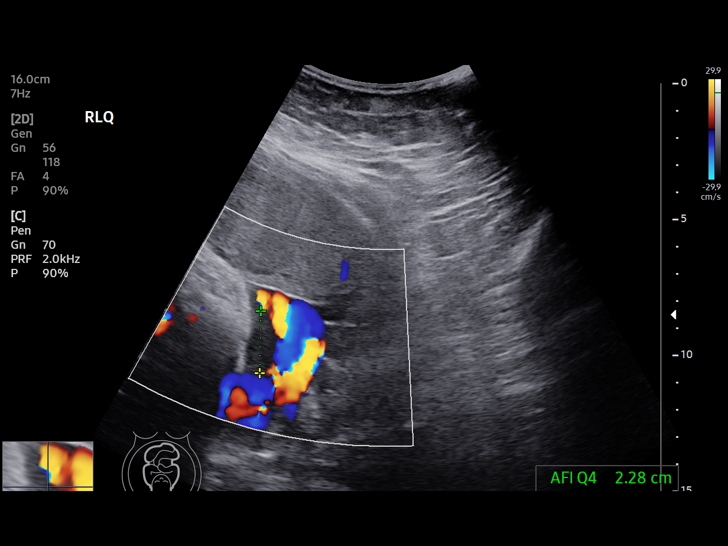

[14 of 14 positions shown; findings below may reference images not displayed]

[REDACTED]care at

Service(s) Provided

Indications

 36 weeks gestation of pregnancy
 Hypertension - Gestational
 Obesity complicating pregnancy, third
 trimester
Vital Signs

                                                Height:        5'6"
Fetal Evaluation

 Num Of Fetuses:          1
 Preg. Location:          Intrauterine
 Cardiac Activity:        Observed
 Presentation:            Cephalic

 Amniotic Fluid
 AFI FV:      Within normal limits

 AFI Sum(cm)     %Tile       Largest Pocket(cm)
 10.89           30

 RUQ(cm)       RLQ(cm)       LUQ(cm)        LLQ(cm)

 Comment:    Breathing noted intermittently, but not sustained.
Biophysical Evaluation

 Amniotic F.V:   Pocket => 2 cm             F. Tone:         Observed
 F. Movement:    Observed                   N.S.T:           Reactive
 F. Breathing:   Not Observed               Score:           [DATE]
OB History

 Gravidity:    2         Term:   1
 Living:       1
Gestational Age

 Clinical EDD:  36w 4d                                        EDD:   12/29/20
 Best:          36w 4d     Det. By:  Clinical EDD             EDD:   12/29/20

## 2021-08-17 ENCOUNTER — Other Ambulatory Visit: Payer: Self-pay | Admitting: Physician Assistant

## 2021-08-17 DIAGNOSIS — K469 Unspecified abdominal hernia without obstruction or gangrene: Secondary | ICD-10-CM

## 2021-09-14 ENCOUNTER — Other Ambulatory Visit: Payer: Self-pay

## 2021-09-14 ENCOUNTER — Ambulatory Visit
Admission: RE | Admit: 2021-09-14 | Discharge: 2021-09-14 | Disposition: A | Discharge status: Home / Self Care | Payer: Medicaid Other | Source: Ambulatory Visit | Attending: Physician Assistant | Admitting: Physician Assistant

## 2021-09-14 DIAGNOSIS — K469 Unspecified abdominal hernia without obstruction or gangrene: Secondary | ICD-10-CM

## 2021-09-14 MED ORDER — IOPAMIDOL (ISOVUE-300) INJECTION 61%
100.0000 mL | Freq: Once | INTRAVENOUS | Status: AC | PRN
Start: 2021-09-14 — End: 2021-09-14
  Administered 2021-09-14: 100 mL via INTRAVENOUS

## 2022-07-30 ENCOUNTER — Other Ambulatory Visit: Payer: Self-pay

## 2022-07-30 ENCOUNTER — Encounter (HOSPITAL_BASED_OUTPATIENT_CLINIC_OR_DEPARTMENT_OTHER): Payer: Self-pay | Admitting: Emergency Medicine

## 2022-07-30 ENCOUNTER — Emergency Department (HOSPITAL_BASED_OUTPATIENT_CLINIC_OR_DEPARTMENT_OTHER)
Admission: EM | Admit: 2022-07-30 | Discharge: 2022-07-30 | Disposition: A | Discharge status: Home / Self Care | Payer: BC Managed Care – PPO | Attending: Emergency Medicine | Admitting: Emergency Medicine

## 2022-07-30 DIAGNOSIS — R109 Unspecified abdominal pain: Secondary | ICD-10-CM | POA: Diagnosis present

## 2022-07-30 DIAGNOSIS — J45909 Unspecified asthma, uncomplicated: Secondary | ICD-10-CM | POA: Diagnosis not present

## 2022-07-30 LAB — PREGNANCY, URINE: Preg Test, Ur: NEGATIVE

## 2022-07-30 MED ORDER — DICYCLOMINE HCL 20 MG PO TABS: 20.0000 mg | ORAL_TABLET | Freq: Three times a day (TID) | ORAL | 0 refills | Status: AC | PRN

## 2022-07-30 NOTE — ED Provider Notes (Signed)
Emergency Department Provider Note   I have reviewed the triage vital signs and the nursing notes.   HISTORY  Chief Complaint Abdominal Pain   HPI Emily Davila is a 37 y.o. female presents to the emergency department from her GYN office for reevaluation.  Patient reports feeling "kicking" like movements in the abdomen.  She has had irregular menstrual cycles with her last treatment cycle being in October.  She has had 2 prior pregnancies.  She went to her OB/GYN office today for evaluation at which point she tested negative for pregnancy.  She describes to me that her symptoms were not fully explained to her satisfaction and so came to the ED for further evaluation.  She is not having dysuria, hesitancy, urgency.  No fevers or chills.  No chest pain or shortness of breath.   Past Medical History:  Diagnosis Date   Anemia affecting pregnancy 12/14/2020   Anxiety 09/02/2015   Asthma    BV (bacterial vaginosis)    E. coli UTI    Encounter for insertion of progestin-releasing intrauterine contraceptive device (IUD) 02/03/2021   H/O seasonal allergies    Headache(784.0)    PID (acute pelvic inflammatory disease)    Pregnancy induced hypertension     Review of Systems  Constitutional: No fever/chills Cardiovascular: Denies chest pain. Respiratory: Denies shortness of breath. Gastrointestinal: Positive abdominal pain.  No nausea, no vomiting.  No diarrhea.  No constipation. Genitourinary: Negative for dysuria. Musculoskeletal: Negative for back pain. Skin: Negative for rash. Neurological: Negative for headaches, focal weakness or numbness.  ____________________________________________   PHYSICAL EXAM:  VITAL SIGNS: ED Triage Vitals  Enc Vitals Group     BP 07/30/22 1056 (!) 146/90     Pulse Rate 07/30/22 1054 79     Resp 07/30/22 1054 18     Temp 07/30/22 1054 98.4 F (36.9 C)     Temp Source 07/30/22 1054 Oral     SpO2 07/30/22 1054 100 %   Constitutional:  Alert and oriented. Well appearing and in no acute distress. Eyes: Conjunctivae are normal.  Head: Atraumatic. Nose: No congestion/rhinnorhea. Mouth/Throat: Mucous membranes are moist.   Neck: No stridor.   Cardiovascular: Normal rate, regular rhythm. Good peripheral circulation. Grossly normal heart sounds.   Respiratory: Normal respiratory effort.  No retractions. Lungs CTAB. Gastrointestinal: Soft and nontender. No distention.  Musculoskeletal: No lower extremity tenderness nor edema. No gross deformities of extremities. Neurologic:  Normal speech and language. No gross focal neurologic deficits are appreciated.  Skin:  Skin is warm, dry and intact. No rash noted.  ____________________________________________   LABS (all labs ordered are listed, but only abnormal results are displayed)  Labs Reviewed  PREGNANCY, URINE    ____________________________________________   PROCEDURES  Procedure(s) performed:   Procedures  None  ____________________________________________   INITIAL IMPRESSION / ASSESSMENT AND PLAN / ED COURSE  Pertinent labs & imaging results that were available during my care of the patient were reviewed by me and considered in my medical decision making (see chart for details).   This patient is Presenting for Evaluation of abdominal pain, which does require a range of treatment options, and is a complaint that involves a high risk of morbidity and mortality.  The Differential Diagnoses includes but is not exclusive to ectopic pregnancy, ovarian cyst, ovarian torsion, acute appendicitis, urinary tract infection, endometriosis, bowel obstruction, hernia, colitis, renal colic, gastroenteritis, volvulus etc.  Clinical Laboratory Tests Ordered, included urine pregnancy negative.    Medical Decision Making: Summary:  Patient describes intermittent twitching/kicking sensation in her right abdomen.  Abdomen diffusely soft and nontender.  No hernias.  Her urine  pregnancy is negative.  No UTI symptoms.  She has had some intermittent light menstrual cycles over the past several months.  I do not see an indication for further abdominal imaging or testing at this time.  We discussed repeating urine pregnancy test from home if she can remains concerned but otherwise will prescribe Bentyl and have her follow close with her primary care physician.  In terms of her irregular menstrual cycle I encouraged her to follow closely with GYN for evaluation.   Patient's presentation is most consistent with acute, uncomplicated illness.   Disposition: discharge  ____________________________________________  FINAL CLINICAL IMPRESSION(S) / ED DIAGNOSES  Final diagnoses:  Abdominal pain, unspecified abdominal location     NEW OUTPATIENT MEDICATIONS STARTED DURING THIS VISIT:  Discharge Medication List as of 07/30/2022 11:45 AM     START taking these medications   Details  dicyclomine (BENTYL) 20 MG tablet Take 1 tablet (20 mg total) by mouth 3 (three) times daily as needed., Starting Mon 07/30/2022, Normal        Note:  This document was prepared using Dragon voice recognition software and may include unintentional dictation errors.  Nanda Quinton, MD, [facility]    Lashonda Sonneborn, [facility], MD 07/30/22 [phone]

## 2022-07-30 NOTE — ED Triage Notes (Signed)
Pt presents with feelings of "movement" in her abdomen.  Negative pregnancy tests.  Has been to GYN today and states "they didn't tell me much"  She is concerned she is truly pregnant and that would mean she "would be about 5 months along."  Missed a period in December and had a "light period" in January

## 2022-07-30 NOTE — Discharge Instructions (Signed)

## 2024-01-29 ENCOUNTER — Emergency Department (HOSPITAL_BASED_OUTPATIENT_CLINIC_OR_DEPARTMENT_OTHER)
Admission: EM | Admit: 2024-01-29 | Discharge: 2024-01-29 | Disposition: A | Discharge status: Home / Self Care | Payer: Self-pay | Attending: Emergency Medicine | Admitting: Emergency Medicine

## 2024-01-29 ENCOUNTER — Other Ambulatory Visit: Payer: Self-pay

## 2024-01-29 ENCOUNTER — Encounter (HOSPITAL_BASED_OUTPATIENT_CLINIC_OR_DEPARTMENT_OTHER): Payer: Self-pay

## 2024-01-29 ENCOUNTER — Emergency Department (HOSPITAL_BASED_OUTPATIENT_CLINIC_OR_DEPARTMENT_OTHER): Payer: Self-pay

## 2024-01-29 ENCOUNTER — Other Ambulatory Visit (HOSPITAL_BASED_OUTPATIENT_CLINIC_OR_DEPARTMENT_OTHER): Payer: Self-pay

## 2024-01-29 DIAGNOSIS — Z91040 Latex allergy status: Secondary | ICD-10-CM | POA: Insufficient documentation

## 2024-01-29 DIAGNOSIS — J45909 Unspecified asthma, uncomplicated: Secondary | ICD-10-CM | POA: Insufficient documentation

## 2024-01-29 DIAGNOSIS — U071 COVID-19: Secondary | ICD-10-CM | POA: Insufficient documentation

## 2024-01-29 DIAGNOSIS — N3001 Acute cystitis with hematuria: Secondary | ICD-10-CM | POA: Insufficient documentation

## 2024-01-29 DIAGNOSIS — Z7951 Long term (current) use of inhaled steroids: Secondary | ICD-10-CM | POA: Insufficient documentation

## 2024-01-29 LAB — URINALYSIS, W/ REFLEX TO CULTURE (INFECTION SUSPECTED)
Bilirubin Urine: NEGATIVE
Glucose, UA: NEGATIVE mg/dL
Ketones, ur: NEGATIVE mg/dL
Nitrite: NEGATIVE
Protein, ur: 100 mg/dL — AB
Specific Gravity, Urine: 1.02 (ref 1.005–1.030)
pH: 8.5 — ABNORMAL HIGH (ref 5.0–8.0)

## 2024-01-29 LAB — GROUP A STREP BY PCR: Group A Strep by PCR: NOT DETECTED

## 2024-01-29 LAB — COMPREHENSIVE METABOLIC PANEL WITH GFR
ALT: 27 U/L (ref 0–44)
AST: 22 U/L (ref 15–41)
Albumin: 4.2 g/dL (ref 3.5–5.0)
Alkaline Phosphatase: 81 U/L (ref 38–126)
Anion gap: 11 (ref 5–15)
BUN: 7 mg/dL (ref 6–20)
CO2: 20 mmol/L — ABNORMAL LOW (ref 22–32)
Calcium: 9.2 mg/dL (ref 8.9–10.3)
Chloride: 104 mmol/L (ref 98–111)
Creatinine, Ser: 0.71 mg/dL (ref 0.44–1.00)
GFR, Estimated: >60 mL/min (ref >60)
Glucose, Bld: 103 mg/dL — ABNORMAL HIGH (ref 70–99)
Potassium: 3.9 mmol/L (ref 3.5–5.1)
Sodium: 136 mmol/L (ref 135–145)
Total Bilirubin: 0.5 mg/dL (ref 0.0–1.2)
Total Protein: 7.4 g/dL (ref 6.5–8.1)

## 2024-01-29 LAB — HCG, SERUM, QUALITATIVE: Preg, Serum: NEGATIVE

## 2024-01-29 LAB — CBC WITH DIFFERENTIAL/PLATELET
Abs Immature Granulocytes: 0.02 K/uL (ref 0.00–0.07)
Basophils Absolute: 0.1 K/uL (ref 0.0–0.1)
Basophils Relative: 1 %
Eosinophils Absolute: 0 K/uL (ref 0.0–0.5)
Eosinophils Relative: 0 %
HCT: 39.8 % (ref 36.0–46.0)
Hemoglobin: 13.8 g/dL (ref 12.0–15.0)
Immature Granulocytes: 0 %
Lymphocytes Relative: 12 %
Lymphs Abs: 0.9 K/uL (ref 0.7–4.0)
MCH: 31.6 pg (ref 26.0–34.0)
MCHC: 34.7 g/dL (ref 30.0–36.0)
MCV: 91.1 fL (ref 80.0–100.0)
Monocytes Absolute: 1.2 K/uL — ABNORMAL HIGH (ref 0.1–1.0)
Monocytes Relative: 17 %
Neutro Abs: 4.7 K/uL (ref 1.7–7.7)
Neutrophils Relative %: 70 %
Platelets: 301 K/uL (ref 150–400)
RBC: 4.37 MIL/uL (ref 3.87–5.11)
RDW: 13.2 % (ref 11.5–15.5)
WBC: 6.8 K/uL (ref 4.0–10.5)
nRBC: 0 % (ref 0.0–0.2)

## 2024-01-29 LAB — LIPASE, BLOOD: Lipase: 23 U/L (ref 11–51)

## 2024-01-29 LAB — RESP PANEL BY RT-PCR (RSV, FLU A&B, COVID)  RVPGX2
Influenza A by PCR: NEGATIVE
Influenza B by PCR: NEGATIVE
Resp Syncytial Virus by PCR: NEGATIVE
SARS Coronavirus 2 by RT PCR: POSITIVE — AB

## 2024-01-29 MED ORDER — ACETAMINOPHEN 500 MG PO TABS
ORAL_TABLET | ORAL | Status: AC
  Administered 2024-01-29: 1000 mg via ORAL
  Filled 2024-01-29: qty 2

## 2024-01-29 MED ORDER — SODIUM CHLORIDE 0.9 % IV SOLN
1.0000 g | Freq: Once | INTRAVENOUS | Status: AC
  Administered 2024-01-29: 1 g via INTRAVENOUS
  Filled 2024-01-29: qty 10

## 2024-01-29 MED ORDER — KETOROLAC TROMETHAMINE 15 MG/ML IJ SOLN
15.0000 mg | Freq: Once | INTRAMUSCULAR | Status: AC
  Administered 2024-01-29: 15 mg via INTRAVENOUS
  Filled 2024-01-29: qty 1

## 2024-01-29 MED ORDER — ACETAMINOPHEN 500 MG PO TABS: 1000.0000 mg | ORAL_TABLET | Freq: Once | ORAL | Status: AC

## 2024-01-29 MED ORDER — IOHEXOL 300 MG/ML  SOLN
100.0000 mL | Freq: Once | INTRAMUSCULAR | Status: AC | PRN
  Administered 2024-01-29: 100 mL via INTRAVENOUS

## 2024-01-29 MED ORDER — ACETAMINOPHEN 325 MG PO TABS: 650.0000 mg | ORAL_TABLET | Freq: Once | ORAL | Status: DC

## 2024-01-29 MED ORDER — FENTANYL CITRATE PF 50 MCG/ML IJ SOSY: 50.0000 ug | PREFILLED_SYRINGE | Freq: Once | INTRAMUSCULAR | Status: DC

## 2024-01-29 MED ORDER — LACTATED RINGERS IV BOLUS
1000.0000 mL | Freq: Once | INTRAVENOUS | Status: AC
  Administered 2024-01-29: 1000 mL via INTRAVENOUS

## 2024-01-29 MED ORDER — CEFUROXIME AXETIL 500 MG PO TABS
500.0000 mg | ORAL_TABLET | Freq: Two times a day (BID) | ORAL | 0 refills | Status: AC
  Filled 2024-01-29: qty 10, 5d supply, fill #0

## 2024-01-29 MED ORDER — CEFUROXIME AXETIL 500 MG PO TABS: 500.0000 mg | ORAL_TABLET | Freq: Two times a day (BID) | ORAL | 0 refills | Status: DC

## 2024-01-29 NOTE — ED Provider Notes (Signed)
 [facility] Provider Note   CSN: [id number] Arrival date & time: 01/29/24  1104     Patient presents with: Headache and bodyaches   Emily Davila is a 38 y.o. female.    Headache    38 year old female with medical history significant for asthma, PID, anxiety, history of cholecystectomy who presents to the emergency department with generalized body pain, abdominal pain, headache, myalgias.  She has had chills.  Symptoms been ongoing for the past few days.  She denies any sore throat, denies any runny nose, she denies any nausea or vomiting.  She states that she is having pain in the right lower quadrant  of her abdomen. She endorses right sided flank pain that is sharp. She endorses urinary frequency.   Prior to Admission medications   Medication Sig Start Date End Date Taking? Authorizing Provider  cefUROXime  (CEFTIN ) 500 MG tablet Take 1 tablet (500 mg total) by mouth 2 (two) times daily with a meal for 5 days. 01/29/24 02/03/24 Yes Jerrol Agent, MD  acetaminophen  (TYLENOL ) 500 MG tablet Take 1,000 mg by mouth every 6 (six) hours as needed.    [provider]  albuterol  (VENTOLIN  HFA) 108 (90 Base) MCG/ACT inhaler Inhale 1 puff into the lungs every 4 (four) hours as needed for wheezing or shortness of breath. 06/15/20   Lola Donnice HERO, MD  amLODipine  (NORVASC ) 5 MG tablet Take 1 tablet (5 mg total) by mouth daily. Patient not taking: Reported on 02/03/2021 12/14/20 12/14/21  Trudy Earnie CROME, CNM  dicyclomine  (BENTYL ) 20 MG tablet Take 1 tablet (20 mg total) by mouth 3 (three) times daily as needed. 07/30/22   Long, Joshua G, MD  ferrous sulfate  (FERROUSUL) 325 (65 FE) MG tablet Take 1 tablet (325 mg total) by mouth 2 (two) times daily. Patient not taking: No sig reported 12/14/20   Trudy Earnie CROME, CNM  ibuprofen  (ADVIL ) 600 MG tablet Take 1 tablet (600 mg total) by mouth every 6 (six) hours. 12/14/20   Trudy Earnie CROME, CNM   labetalol  (NORMODYNE ) 200 MG tablet Take 1 tablet (200 mg total) by mouth 2 (two) times daily. 01/26/21   Zina Jerilynn LABOR, MD  loratadine (CLARITIN) 10 MG tablet Take 10 mg by mouth daily. Patient not taking: No sig reported    [provider]  Prenatal Vit-Fe Fumarate-FA (PRENATAL VITAMIN PO) Take 1 tablet by mouth daily.    [provider]    Allergies: Latex and Keflex  [cephalexin ]    Review of Systems  Neurological:  Positive for headaches.  All other systems reviewed and are negative.   Updated Vital Signs BP 116/68   Pulse 77   Temp 100.2 F (37.9 C) (Oral)   Resp 13   Ht 5' 5.5 (1.664 m)   Wt 104.3 kg   SpO2 100%   BMI 37.69 kg/m   Physical Exam Vitals and nursing note reviewed.  Constitutional:      General: She is not in acute distress.    Appearance: She is well-developed.  HENT:     Head: Normocephalic and atraumatic.  Eyes:     Conjunctiva/sclera: Conjunctivae normal.  Cardiovascular:     Rate and Rhythm: Normal rate and regular rhythm.     Heart sounds: No murmur heard. Pulmonary:     Effort: Pulmonary effort is normal. No respiratory distress.     Breath sounds: Normal breath sounds.  Abdominal:     Palpations: Abdomen is soft.  Tenderness: There is abdominal tenderness in the right lower quadrant. There is no guarding.  Musculoskeletal:        General: No swelling.     Cervical back: Neck supple.  Skin:    General: Skin is warm and dry.     Capillary Refill: Capillary refill takes less than 2 seconds.  Neurological:     Mental Status: She is alert.  Psychiatric:        Mood and Affect: Mood normal.     (all labs ordered are listed, but only abnormal results are displayed) Labs Reviewed  RESP PANEL BY RT-PCR (RSV, FLU A&B, COVID)  RVPGX2 - Abnormal; Notable for the following components:      Result Value   SARS Coronavirus 2 by RT PCR POSITIVE (*)    All other components within normal limits  CBC WITH  DIFFERENTIAL/PLATELET - Abnormal; Notable for the following components:   Monocytes Absolute 1.2 (*)    All other components within normal limits  COMPREHENSIVE METABOLIC PANEL WITH GFR - Abnormal; Notable for the following components:   CO2 20 (*)    Glucose, Bld 103 (*)    All other components within normal limits  URINALYSIS, W/ REFLEX TO CULTURE (INFECTION SUSPECTED) - Abnormal; Notable for the following components:   APPearance CLOUDY (*)    pH 8.5 (*)    Hgb urine dipstick MODERATE (*)    Protein, ur 100 (*)    Leukocytes,Ua SMALL (*)    Bacteria, UA MANY (*)    All other components within normal limits  GROUP A STREP BY PCR  LIPASE, BLOOD  HCG, SERUM, QUALITATIVE    EKG: None  Radiology: CT ABDOMEN PELVIS W CONTRAST Result Date: 01/29/2024 CLINICAL DATA:  RLQ abdominal pain EXAM: CT ABDOMEN AND PELVIS WITH CONTRAST TECHNIQUE: Multidetector CT imaging of the abdomen and pelvis was performed using the standard protocol following bolus administration of intravenous contrast. RADIATION DOSE REDUCTION: This exam was performed according to the departmental dose-optimization program which includes automated exposure control, adjustment of the mA and/or kV according to patient size and/or use of iterative reconstruction technique. CONTRAST:  OMNIPAQUE  IOHEXOL  300 MG/ML  SOLN COMPARISON:  CT abdomen/pelvis dated 09/14/2021. FINDINGS: Lower chest: No acute abnormality. Hepatobiliary: No focal liver abnormality is seen. Status post cholecystectomy. No biliary dilatation. A Pancreas: Unremarkable. No pancreatic ductal dilatation or surrounding inflammatory changes. Spleen: Normal in size without focal abnormality. Adrenals/Urinary Tract: Adrenal glands are unremarkable. Kidneys are normal, without renal calculi, focal lesion, or hydronephrosis. Bladder is unremarkable. Stomach/Bowel: Stomach is within normal limits. Appendix appears normal. No evidence of bowel wall thickening, distention,  or inflammatory changes. Sigmoid colonic diverticulosis without evidence of acute diverticulitis. Vascular/Lymphatic: Abdominal aorta is normal in caliber. No enlarged abdominal or pelvic lymph nodes. Reproductive: IUD in place near the level of the uterine fundus. No adnexal mass. Other: No abdominopelvic ascites. No free air. No abdominal wall hernia. Musculoskeletal: No acute osseous abnormality. Stable 12 mm lucent lesion in the left iliac bone with narrow zone of transition, essentially unchanged since prior exam dated 09/14/2021, most compatible with a benign etiology. IMPRESSION: 1. No acute localizing findings in the abdomen or pelvis. 2. Sigmoid colonic diverticulosis without evidence of acute diverticulitis. Electronically Signed   By: Harrietta Sherry M.D.   On: 01/29/2024 14:25     Procedures   Medications Ordered in the ED  acetaminophen  (TYLENOL ) tablet 1,000 mg (1,000 mg Oral Given 01/29/24 1118)  cefTRIAXone  (ROCEPHIN ) 1 g in sodium  chloride 0.9 % 100 mL IVPB (0 g Intravenous Stopped 01/29/24 1346)  lactated ringers  bolus 1,000 mL (1,000 mLs Intravenous New Bag/Given 01/29/24 1224)  ketorolac  (TORADOL ) 15 MG/ML injection 15 mg (15 mg Intravenous Given 01/29/24 1228)  iohexol  (OMNIPAQUE ) 300 MG/ML solution 100 mL (100 mLs Intravenous Contrast Given 01/29/24 1251)    Clinical Course as of 01/29/24 1435  Wed Jan 29, 2024  [address]): SMALL [JL]  1200 WBC, UA: 11-20 [JL]  1200 Bacteria, UA(!): MANY [JL]  1200 Squamous Epithelial / HPF: 21-50 [JL]  1249 SARS Coronavirus 2 by RT PCR(!): POSITIVE [JL]    Clinical Course User Index [JL] Jerrol Agent, MD                                 Medical Decision Making Amount and/or Complexity of Data Reviewed Labs: ordered. Decision-making details documented in ED Course. Radiology: ordered.  Risk OTC drugs. Prescription drug management.    38 year old female with medical history significant for asthma, PID, anxiety, history of  cholecystectomy who presents to the emergency department with generalized body pain, abdominal pain, headache, myalgias.  She has had chills.  Symptoms been ongoing for the past few days.  She denies any sore throat, denies any runny nose, she denies any nausea or vomiting.  She states that she is having pain in the right lower quadrant  of her abdomen. She endorses right sided flank pain that is sharp. She endorses urinary frequency.   On arrival, the patient was afebrile, borderline febrile temperature 100.2, not tachycardic heart rate 77, BP 116/68, respirate 13, O2 saturations 100% on room air.  Patient presenting with what sounds like viral infection, COVID-19, considered strep pharyngitis, considered appendicitis, diverticulitis, other acute intra-abdominal emergency.  PCR testing was collected for both group A strep and COVID flu and RSV and resulted positive for COVID-19.  Urinalysis was collected and was positive for urinary tract infection with associated hematuria.  Given the patient's flank pain, abdominal pain, will obtain CT imaging.  CMP was generally unremarkable, CBC without a leukocytosis or anemia, hCG negative, lipase normal.  The patient was covered with IV Rocephin , was administered IV Toradol  as well as acetaminophen  and an LR bolus.  CT imaging: IMPRESSION:  1. No acute localizing findings in the abdomen or pelvis.  2. Sigmoid colonic diverticulosis without evidence of acute  diverticulitis.    Patient feeling overall symptomatically improved on repeat assessment, informed of the results of her diagnostic testing, not septic, overall vitally stable and well-appearing, stable for discharge and continued outpatient management, recommended Tylenol  and Motrin  for pain control and fever control, antibiotics were prescribed for her UTI.     Final diagnoses:  COVID-19  Acute cystitis with hematuria    ED Discharge Orders          Ordered    cefUROXime  (CEFTIN ) 500 MG  tablet  2 times daily with meals        01/29/24 1433               Jerrol Agent, MD 01/29/24 1435

## 2024-01-29 NOTE — Discharge Instructions (Addendum)
 You tested positive for COVID-19 and had evidence of urinary tract infection.  Will discharge you on an antibiotic to treat the urinary tract infection.  Your CT imaging was reassuring.  Recommend Tylenol  and Motrin  for fever control, muscle aches.

## 2024-01-29 NOTE — ED Triage Notes (Signed)
 Arrives POV with complaints of generalized body pain, runny nose, and headache with chills x2 days.

## 2024-01-29 NOTE — ED Notes (Signed)
 To CT

## 2024-01-29 NOTE — ED Notes (Signed)
 Pt up to Br ambulatory states feels better

## 2024-02-22 ENCOUNTER — Other Ambulatory Visit (HOSPITAL_COMMUNITY): Payer: Self-pay

## 2024-07-11 ENCOUNTER — Other Ambulatory Visit (HOSPITAL_COMMUNITY): Payer: Self-pay
